# Patient Record
Sex: Female | Born: 1990 | Hispanic: Yes | Smoking: Former smoker
Health system: Southern US, Community
[De-identification: ages and names within clinical notes are randomized; demographics above are authoritative.]

## PROBLEM LIST (undated history)

## (undated) DIAGNOSIS — G43909 Migraine, unspecified, not intractable, without status migrainosus: Secondary | ICD-10-CM

## (undated) DIAGNOSIS — R87629 Unspecified abnormal cytological findings in specimens from vagina: Secondary | ICD-10-CM

## (undated) HISTORY — PX: APPENDECTOMY: SHX54

## (undated) HISTORY — PX: CHOLECYSTECTOMY: SHX55

## (undated) HISTORY — PX: KNEE SURGERY: SHX244

## (undated) HISTORY — DX: Unspecified abnormal cytological findings in specimens from vagina: R87.629

---
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
## 2008-06-14 ENCOUNTER — Emergency Department: Payer: Self-pay | Admitting: Emergency Medicine

## 2008-08-29 ENCOUNTER — Emergency Department: Payer: Self-pay | Admitting: Emergency Medicine

## 2008-09-05 ENCOUNTER — Emergency Department: Payer: Self-pay | Admitting: Emergency Medicine

## 2008-09-18 ENCOUNTER — Emergency Department: Payer: Self-pay | Admitting: Unknown Physician Specialty

## 2008-09-19 ENCOUNTER — Emergency Department: Payer: Self-pay | Admitting: Emergency Medicine

## 2008-09-23 ENCOUNTER — Emergency Department: Payer: Self-pay | Admitting: Emergency Medicine

## 2008-09-27 ENCOUNTER — Emergency Department: Payer: Self-pay | Admitting: Emergency Medicine

## 2008-10-18 ENCOUNTER — Emergency Department: Payer: Self-pay | Admitting: Unknown Physician Specialty

## 2008-10-20 ENCOUNTER — Emergency Department: Payer: Self-pay | Admitting: Emergency Medicine

## 2008-11-28 ENCOUNTER — Observation Stay: Payer: Self-pay | Admitting: Obstetrics and Gynecology

## 2009-01-28 ENCOUNTER — Observation Stay: Payer: Self-pay | Admitting: Obstetrics and Gynecology

## 2009-09-21 ENCOUNTER — Emergency Department: Payer: Self-pay | Admitting: Emergency Medicine

## 2011-10-19 ENCOUNTER — Emergency Department: Payer: Self-pay | Admitting: Emergency Medicine

## 2011-10-19 LAB — URINALYSIS, COMPLETE
Bacteria: NONE SEEN
Bilirubin,UR: NEGATIVE
Glucose,UR: NEGATIVE mg/dL (ref 0–75)
Ketone: NEGATIVE
Leukocyte Esterase: NEGATIVE
Nitrite: NEGATIVE
Ph: 6 (ref 4.5–8.0)
Protein: NEGATIVE
RBC,UR: 3 /HPF (ref 0–5)
Specific Gravity: 1.013 (ref 1.003–1.030)
Squamous Epithelial: 1
WBC UR: 2 /HPF (ref 0–5)

## 2011-10-19 LAB — PREGNANCY, URINE: Pregnancy Test, Urine: NEGATIVE m[IU]/mL

## 2011-10-22 ENCOUNTER — Emergency Department: Payer: Self-pay | Admitting: *Deleted

## 2012-01-26 ENCOUNTER — Emergency Department: Payer: Self-pay | Admitting: Emergency Medicine

## 2012-01-26 LAB — URINALYSIS, COMPLETE
Bilirubin,UR: NEGATIVE
Glucose,UR: NEGATIVE mg/dL (ref 0–75)
Ketone: NEGATIVE
Nitrite: NEGATIVE
Ph: 6 (ref 4.5–8.0)
Protein: NEGATIVE
RBC,UR: 3 /HPF (ref 0–5)
Specific Gravity: 1.001 (ref 1.003–1.030)
Squamous Epithelial: 3
WBC UR: 3 /HPF (ref 0–5)

## 2012-01-26 LAB — BASIC METABOLIC PANEL
Anion Gap: 8 (ref 7–16)
BUN: 8 mg/dL (ref 7–18)
Calcium, Total: 9.1 mg/dL (ref 8.5–10.1)
Chloride: 108 mmol/L — ABNORMAL HIGH (ref 98–107)
Co2: 26 mmol/L (ref 21–32)
Creatinine: 0.48 mg/dL — ABNORMAL LOW (ref 0.60–1.30)
EGFR (African American): >60
EGFR (Non-African Amer.): >60
Glucose: 90 mg/dL (ref 65–99)
Osmolality: 281 (ref 275–301)
Potassium: 3.6 mmol/L (ref 3.5–5.1)
Sodium: 142 mmol/L (ref 136–145)

## 2012-01-26 LAB — WET PREP, GENITAL

## 2012-01-26 LAB — CBC
HCT: 34.8 % — ABNORMAL LOW (ref 35.0–47.0)
HGB: 12 g/dL (ref 12.0–16.0)
MCH: 29.6 pg (ref 26.0–34.0)
MCHC: 34.5 g/dL (ref 32.0–36.0)
MCV: 86 fL (ref 80–100)
Platelet: 235 x10 3/mm 3 (ref 150–440)
RBC: 4.05 X10 6/mm 3 (ref 3.80–5.20)
RDW: 12.5 % (ref 11.5–14.5)
WBC: 9.1 x10 3/mm 3 (ref 3.6–11.0)

## 2012-01-27 LAB — URINE CULTURE

## 2012-10-27 ENCOUNTER — Observation Stay: Payer: Self-pay

## 2012-10-27 LAB — URINALYSIS, COMPLETE
Bacteria: NONE SEEN
Bilirubin,UR: NEGATIVE
Blood: NEGATIVE
Glucose,UR: NEGATIVE mg/dL (ref 0–75)
Ketone: NEGATIVE
Leukocyte Esterase: NEGATIVE
Nitrite: NEGATIVE
Ph: 7 (ref 4.5–8.0)
Protein: NEGATIVE
RBC,UR: NONE SEEN /HPF (ref 0–5)
Specific Gravity: 1.001 (ref 1.003–1.030)
Squamous Epithelial: 2
WBC UR: <1 /HPF (ref 0–5)

## 2012-12-28 DIAGNOSIS — G43909 Migraine, unspecified, not intractable, without status migrainosus: Secondary | ICD-10-CM | POA: Insufficient documentation

## 2012-12-28 DIAGNOSIS — F329 Major depressive disorder, single episode, unspecified: Secondary | ICD-10-CM | POA: Insufficient documentation

## 2013-03-14 ENCOUNTER — Emergency Department: Payer: Self-pay | Admitting: Emergency Medicine

## 2013-03-17 ENCOUNTER — Emergency Department: Payer: Self-pay | Admitting: Emergency Medicine

## 2013-04-18 ENCOUNTER — Emergency Department: Payer: Self-pay | Admitting: Emergency Medicine

## 2013-04-18 LAB — URINALYSIS, COMPLETE
Bilirubin,UR: NEGATIVE
Glucose,UR: NEGATIVE mg/dL (ref 0–75)
Ketone: NEGATIVE
Leukocyte Esterase: NEGATIVE
Nitrite: NEGATIVE
Ph: 5 (ref 4.5–8.0)
Protein: NEGATIVE
RBC,UR: 2 /HPF (ref 0–5)
Specific Gravity: 1.011 (ref 1.003–1.030)
Squamous Epithelial: 7
WBC UR: 5 /HPF (ref 0–5)

## 2013-04-18 LAB — COMPREHENSIVE METABOLIC PANEL
Albumin: 3.8 g/dL (ref 3.4–5.0)
Alkaline Phosphatase: 101 U/L
Anion Gap: 8 (ref 7–16)
BUN: 10 mg/dL (ref 7–18)
Bilirubin,Total: 0.3 mg/dL (ref 0.2–1.0)
Calcium, Total: 8.7 mg/dL (ref 8.5–10.1)
Chloride: 111 mmol/L — ABNORMAL HIGH (ref 98–107)
Co2: 22 mmol/L (ref 21–32)
Creatinine: 0.55 mg/dL — ABNORMAL LOW (ref 0.60–1.30)
EGFR (African American): >60
EGFR (Non-African Amer.): >60
Glucose: 95 mg/dL (ref 65–99)
Osmolality: 280 (ref 275–301)
Potassium: 3.7 mmol/L (ref 3.5–5.1)
SGOT(AST): 17 U/L (ref 15–37)
SGPT (ALT): 30 U/L (ref 12–78)
Sodium: 141 mmol/L (ref 136–145)
Total Protein: 7.2 g/dL (ref 6.4–8.2)

## 2013-04-18 LAB — CBC
HCT: 38.9 % (ref 35.0–47.0)
HGB: 13 g/dL (ref 12.0–16.0)
MCH: 29.2 pg (ref 26.0–34.0)
MCHC: 33.5 g/dL (ref 32.0–36.0)
MCV: 87 fL (ref 80–100)
Platelet: 188 10*3/uL (ref 150–440)
RBC: 4.46 10*6/uL (ref 3.80–5.20)
RDW: 13.1 % (ref 11.5–14.5)
WBC: 6.5 10*3/uL (ref 3.6–11.0)

## 2013-05-17 ENCOUNTER — Emergency Department: Payer: Self-pay | Admitting: Emergency Medicine

## 2013-07-16 ENCOUNTER — Emergency Department: Payer: Self-pay | Admitting: Emergency Medicine

## 2013-07-16 LAB — LIPASE, BLOOD: Lipase: 248 U/L (ref 73–393)

## 2013-07-16 LAB — CBC WITH DIFFERENTIAL/PLATELET
Basophil #: 0.1 10*3/uL (ref 0.0–0.1)
Basophil %: 0.9 %
Eosinophil #: 0.3 10*3/uL (ref 0.0–0.7)
Eosinophil %: 3.2 %
HCT: 39.9 % (ref 35.0–47.0)
HGB: 13.4 g/dL (ref 12.0–16.0)
Lymphocyte #: 3.1 10*3/uL (ref 1.0–3.6)
Lymphocyte %: 38 %
MCH: 29.2 pg (ref 26.0–34.0)
MCHC: 33.7 g/dL (ref 32.0–36.0)
MCV: 87 fL (ref 80–100)
Monocyte #: 0.3 x10 3/mm (ref 0.2–0.9)
Monocyte %: 4.3 %
Neutrophil #: 4.4 10*3/uL (ref 1.4–6.5)
Neutrophil %: 53.6 %
Platelet: 234 10*3/uL (ref 150–440)
RBC: 4.61 10*6/uL (ref 3.80–5.20)
RDW: 13.4 % (ref 11.5–14.5)
WBC: 8.1 10*3/uL (ref 3.6–11.0)

## 2013-07-16 LAB — COMPREHENSIVE METABOLIC PANEL
Albumin: 4 g/dL (ref 3.4–5.0)
Alkaline Phosphatase: 92 U/L
Anion Gap: 2 — ABNORMAL LOW (ref 7–16)
BUN: 7 mg/dL (ref 7–18)
Bilirubin,Total: 0.2 mg/dL (ref 0.2–1.0)
Calcium, Total: 8.9 mg/dL (ref 8.5–10.1)
Chloride: 109 mmol/L — ABNORMAL HIGH (ref 98–107)
Co2: 28 mmol/L (ref 21–32)
Creatinine: 0.83 mg/dL (ref 0.60–1.30)
EGFR (African American): >60
EGFR (Non-African Amer.): >60
Glucose: 77 mg/dL (ref 65–99)
Osmolality: 274 (ref 275–301)
Potassium: 3.5 mmol/L (ref 3.5–5.1)
SGOT(AST): 27 U/L (ref 15–37)
SGPT (ALT): 18 U/L (ref 12–78)
Sodium: 139 mmol/L (ref 136–145)
Total Protein: 7.8 g/dL (ref 6.4–8.2)

## 2013-07-16 LAB — URINALYSIS, COMPLETE
Bilirubin,UR: NEGATIVE
Blood: NEGATIVE
Glucose,UR: NEGATIVE mg/dL (ref 0–75)
Ketone: NEGATIVE
Nitrite: NEGATIVE
Ph: 6 (ref 4.5–8.0)
Protein: NEGATIVE
RBC,UR: 5 /HPF (ref 0–5)
Specific Gravity: 1.016 (ref 1.003–1.030)
Squamous Epithelial: 5
WBC UR: 6 /HPF (ref 0–5)

## 2013-07-16 LAB — WET PREP, GENITAL

## 2013-07-16 LAB — GC/CHLAMYDIA PROBE AMP

## 2014-01-20 LAB — URINALYSIS, COMPLETE
Bilirubin,UR: NEGATIVE
Blood: NEGATIVE
Glucose,UR: NEGATIVE mg/dL (ref 0–75)
Ketone: NEGATIVE
Nitrite: NEGATIVE
Ph: 7 (ref 4.5–8.0)
Protein: NEGATIVE
RBC,UR: 1 /HPF (ref 0–5)
Specific Gravity: 1.01 (ref 1.003–1.030)
Squamous Epithelial: 5
WBC UR: 3 /HPF (ref 0–5)

## 2014-01-20 LAB — COMPREHENSIVE METABOLIC PANEL
Albumin: 3.7 g/dL (ref 3.4–5.0)
Alkaline Phosphatase: 91 U/L
Anion Gap: 6 — ABNORMAL LOW (ref 7–16)
BUN: 7 mg/dL (ref 7–18)
Bilirubin,Total: 0.2 mg/dL (ref 0.2–1.0)
Calcium, Total: 8.8 mg/dL (ref 8.5–10.1)
Chloride: 109 mmol/L — ABNORMAL HIGH (ref 98–107)
Co2: 25 mmol/L (ref 21–32)
Creatinine: 0.57 mg/dL — ABNORMAL LOW (ref 0.60–1.30)
EGFR (African American): >60
EGFR (Non-African Amer.): >60
Glucose: 90 mg/dL (ref 65–99)
Osmolality: 277 (ref 275–301)
Potassium: 3.8 mmol/L (ref 3.5–5.1)
SGOT(AST): 51 U/L — ABNORMAL HIGH (ref 15–37)
SGPT (ALT): 74 U/L — ABNORMAL HIGH
Sodium: 140 mmol/L (ref 136–145)
Total Protein: 7.4 g/dL (ref 6.4–8.2)

## 2014-01-20 LAB — CBC WITH DIFFERENTIAL/PLATELET
Basophil #: 0 10*3/uL (ref 0.0–0.1)
Basophil %: 0.4 %
Eosinophil #: 0.2 10*3/uL (ref 0.0–0.7)
Eosinophil %: 1.5 %
HCT: 40.8 % (ref 35.0–47.0)
HGB: 13.3 g/dL (ref 12.0–16.0)
Lymphocyte #: 2.4 10*3/uL (ref 1.0–3.6)
Lymphocyte %: 22.8 %
MCH: 29.2 pg (ref 26.0–34.0)
MCHC: 32.7 g/dL (ref 32.0–36.0)
MCV: 89 fL (ref 80–100)
Monocyte #: 0.5 x10 3/mm (ref 0.2–0.9)
Monocyte %: 4.4 %
Neutrophil #: 7.5 10*3/uL — ABNORMAL HIGH (ref 1.4–6.5)
Neutrophil %: 70.9 %
Platelet: 249 10*3/uL (ref 150–440)
RBC: 4.58 10*6/uL (ref 3.80–5.20)
RDW: 13.1 % (ref 11.5–14.5)
WBC: 10.5 10*3/uL (ref 3.6–11.0)

## 2014-01-20 LAB — PREGNANCY, URINE: Pregnancy Test, Urine: NEGATIVE m[IU]/mL

## 2014-01-20 LAB — LIPASE, BLOOD: Lipase: 123 U/L (ref 73–393)

## 2014-01-21 ENCOUNTER — Observation Stay: Payer: Self-pay | Admitting: Surgery

## 2014-01-24 LAB — PATHOLOGY REPORT

## 2014-09-03 NOTE — Op Note (Signed)
PATIENT NAME:  Lisa [name], Lisa [name] MR#:  [id number] DATE OF BIRTH:  May 18, 1990  DATE OF PROCEDURE:  01/21/2014  PREOPERATIVE DIAGNOSIS:  Acute appendicitis.  POSTOPERATIVE DIAGNOSIS:  Acute appendicitis.  OPERATION:  Laparoscopic appendectomy.  ANESTHESIA:  General.  SURGEON:  Quentin Orealph L. Ely III, MD  OPERATIVE PROCEDURE:  With the patient in the supine position after administering appropriate general anesthesia, the patient's abdomen was prepped with ChloraPrep and draped in sterile towels.  The patient was placed in the head-down feet-up position. A small infraumbilical incision was made in standard fashion and carried down bluntly through the subcutaneous tissue. A Veress needle was used to cannulate the peritoneal cavity. CO2 was insufflated to appropriate pressure measurements. When approximately 2.5 L of CO2 were instilled, the Veress needle was withdrawn and an 11 mm [employer] port inserted into the peritoneal cavity. Intraperitoneal position was confirmed, and CO2 was reinsufflated. A midepigastric transverse incision was made and an 11 mm port inserted under direct vision. The right lower quadrant was examined. The cecum appeared to be in the right upper quadrant and the appendix lying across toward the midline. It was obviously thickened and injected with some suppurative changes at the base. In the previous cesarean section scar in the suprapubic area, a small incision was made and a 12 mm port inserted under direct vision. Using cameras of the upper port, the midline was inspected. No bowel injuries and no adhesions were identified. The appendix was elevated. The mesoappendix was divided with 2 applications of EndoGIA stapler carrying a white load. The base of the appendix was divided with a single application of EndoGIA stapler carrying a blue load. The division appeared to be right at the base of the appendix. The specimen was captured and the Endo Catch apparatus removed without  difficulty.   The abdomen was then copiously irrigated with warm saline solution. The suprapubic and umbilical incisions were closed using figure-of-eight sutures of 0 Vicryl using the suture passer under direct vision. The abdomen was then desufflated. Remaining ports were withdrawn without difficulty. The skin incisions were closed with 5-0 nylon. The area was infiltrated with 0.25% Marcaine for postoperative pain control. Sterile dressings were applied. The patient returned to the recovery room having tolerated the procedure well. Sponge, instrument, and needle counts were correct x 2 in the operating room.     ____________________________ Quentin Orealph L. Ely III, MD rle:nb D: 01/21/2014 01:53:00 ET T: 01/21/2014 02:19:12 ET JOB#: [id number]  cc: Quentin Orealph L. Ely III, MD, <Dictator> Quentin OreALPH L ELY MD ELECTRONICALLY SIGNED 01/21/2014 4:27

## 2014-09-03 NOTE — H&P (Signed)
PATIENT NAME:  Lisa [name], Lisa [name] MR#:  [id number] DATE OF BIRTH:  Apr 22, 1991  DATE OF ADMISSION:  01/20/2014  PRIMARY CARE PHYSICIAN: None.   ADMITTING PHYSICIAN: Dr. Michela PitcherEly.   CHIEF COMPLAINT: Abdominal pain, nausea, vomiting.   BRIEF HISTORY: [name] is a 24 year old Hispanic woman seen in the Emergency Room with a two week history of dizziness, generalized malaise, week history of back pain and nausea and 24-hour history of abdominal pain, nausea and vomiting. She has been feeling poorly for a couple of weeks, lightheaded and uncomfortable. Over the last week she is noted increasing back and generalized mid epigastric abdominal pain. Her symptoms worsened suddenly today where she felt pronounced right lower quadrant left lower quadrant pain associated with marked vomiting. She presented to the emergency room for further evaluation. Work-up in the emergency room reveals a normal white blood cell count, normal electrolytes, normal vital signs, no fever, tenderness right lower quadrant. She underwent CT scan which demonstrated dilated appendix with possible periappendiceal inflammatory changes. The surgical service was consulted with diagnosis of acute appendicitis.   She denies any other significant GI symptoms.   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 No history of hepatitis, yellow jaundice or diverticulitis. Only other surgery was a C-section. She denies any history of cardiac disease, hypertension, or diabetes. She has a history of hypothyroidism and fibromyalgia.   CURRENT MEDICATIONS: Include medication for fibromyalgia and for her thyroid disease.   REVIEW OF SYSTEMS: Otherwise unremarkable.   SOCIAL HISTORY: She is not a cigarette smoker.   PHYSICAL EXAMINATION:  GENERAL: Clearly uncomfortable, lying in bed, complaining of midepigastric periumbilical abdominal pain.  VITALS: Blood pressure is 120/70, heart  rate is 90 and regular.  HEENT: Exam reveals no scleral icterus. No pupillary abnormalities. No facial deformities.  NECK: Supple, nontender with midline trachea.  CHEST: Clear with no adventitious sounds. She has normal pulmonary excursion.  CARDIAC: No murmurs or gallops to my ear. She seems to be in normal sinus rhythm.  ABDOMEN: Her abdomen is generally soft. She has generalized abdominal tenderness. midepigastric suprapubic left lower quadrant and right lower quadrant. She has perhaps more tenderness on the right side than on the left, but she does have generalized abdominal discomfort. She has hypoactive bowel sounds.  EXTREMITIES: Lower extremity exam reveals full range of motion, no deformities.  PSYCHIATRIC: Normal affect. Normal orientation. The interview was conducted through the hospital interpreter.   ASSESSMENT AND PLAN: This woman certainly has an atypical presentation for appendicitis. Her clinical examination and laboratory work are borderline for this particular diagnosis. However, the CT scan, and [name] have reviewed it personally, do suggest a dilated appendix with possible periappendiceal inflammatory changes. In this setting I do not think we have any options other than to pursue possible laparoscopy. [name] outlined that plan to the patient and her husband. They are in agreement. Risks, benefits, and options have been outlined and accepted.    ____________________________ Carmie Endalph L. Ely III, MD rle:JT D: 01/21/2014 00:08:00 ET T: 01/21/2014 01:30:54 ET JOB#: [id number]  cc: Quentin Orealph L. Ely III, MD, <Dictator> Quentin OreALPH L ELY MD ELECTRONICALLY SIGNED 01/21/2014 4:27

## 2014-12-24 ENCOUNTER — Emergency Department
Admission: EM | Admit: 2014-12-24 | Discharge: 2014-12-24 | Disposition: A | Discharge status: Home / Self Care | Payer: Self-pay | Attending: Emergency Medicine | Admitting: Emergency Medicine

## 2014-12-24 ENCOUNTER — Emergency Department: Payer: Self-pay

## 2014-12-24 ENCOUNTER — Encounter: Payer: Self-pay | Admitting: Emergency Medicine

## 2014-12-24 DIAGNOSIS — Z880 Allergy status to penicillin: Secondary | ICD-10-CM | POA: Insufficient documentation

## 2014-12-24 DIAGNOSIS — W208XXA Other cause of strike by thrown, projected or falling object, initial encounter: Secondary | ICD-10-CM | POA: Insufficient documentation

## 2014-12-24 DIAGNOSIS — Y9289 Other specified places as the place of occurrence of the external cause: Secondary | ICD-10-CM | POA: Insufficient documentation

## 2014-12-24 DIAGNOSIS — L03012 Cellulitis of left finger: Secondary | ICD-10-CM | POA: Insufficient documentation

## 2014-12-24 DIAGNOSIS — Y990 Civilian activity done for income or pay: Secondary | ICD-10-CM | POA: Insufficient documentation

## 2014-12-24 DIAGNOSIS — Y9389 Activity, other specified: Secondary | ICD-10-CM | POA: Insufficient documentation

## 2014-12-24 MED ORDER — OXYCODONE-ACETAMINOPHEN 5-325 MG PO TABS: 1.0000 | ORAL_TABLET | Freq: Four times a day (QID) | ORAL | Status: DC | PRN

## 2014-12-24 MED ORDER — SULFAMETHOXAZOLE-TRIMETHOPRIM 800-160 MG PO TABS: 1.0000 | ORAL_TABLET | Freq: Two times a day (BID) | ORAL | Status: DC

## 2014-12-24 MED ORDER — OXYCODONE HCL 5 MG PO TABS
5.0000 mg | ORAL_TABLET | Freq: Once | ORAL | Status: AC
  Administered 2014-12-24: 5 mg via ORAL
  Filled 2014-12-24: qty 1

## 2014-12-24 NOTE — ED Provider Notes (Signed)
[facility] Emergency Department Provider Note  ____________________________________________  Time seen: Approximately 7:57 AM  I have reviewed the triage vital signs and the nursing notes.   HISTORY  Chief Complaint Finger Injury  HPI Lisa [name] is a 24 y.o. female who presents to the emergency department for evaluation of left long finger pain and swelling. She states that she scratched her finger on a paper wrapper while at work 4 days ago, then cleaning solution spilled into the wound and she has had pain, swelling, and burning since which is worsening every day.   History reviewed. No pertinent past medical history.  There are no active problems to display for this patient.   Past Surgical History  Procedure Laterality Date  . Appendectomy    . Cholecystectomy      Current Outpatient Rx  Name  Route  Sig  Dispense  Refill  . oxyCODONE-acetaminophen (ROXICET) 5-325 MG per tablet   Oral   Take 1 tablet by mouth every 6 (six) hours as needed.   9 tablet   0   . sulfamethoxazole-trimethoprim (BACTRIM DS,SEPTRA DS) 800-160 MG per tablet   Oral   Take 1 tablet by mouth 2 (two) times daily.   20 tablet   0     Allergies Amoxicillin  History reviewed. No pertinent family history.  Social History Social History  Substance Use Topics  . Smoking status: Never Smoker   . Smokeless tobacco: None  . Alcohol Use: No    Review of Systems   Constitutional: No fever/chills Eyes: No visual changes. ENT: No congestion or rhinorrhea Cardiovascular: Denies chest pain. Respiratory: Denies shortness of breath. Gastrointestinal: No abdominal pain.  No nausea, no vomiting.  No diarrhea.  No constipation. Genitourinary: Negative for dysuria. Musculoskeletal: Negative for back pain. Skin: Swelling and pain to the left third finger Neurological: Negative for headaches, focal weakness or numbness.  10-point ROS otherwise  negative.  ____________________________________________   PHYSICAL EXAM:  VITAL SIGNS: ED Triage Vitals  Enc Vitals Group     BP --      Pulse --      Resp --      Temp --      Temp src --      SpO2 --      Weight --      Height --      Head Cir --      Peak Flow --      Pain Score 12/24/14 0747 10     Pain Loc --      Pain Edu? --      Excl. in GC? --     Constitutional: Alert and oriented. Well appearing and in no acute distress. Eyes: Conjunctivae are normal. PERRL. EOMI. Head: Atraumatic. Nose: No congestion/rhinnorhea. Mouth/Throat: Mucous membranes are moist.  Oropharynx non-erythematous. No oral lesions. Neck: No stridor. Cardiovascular: Normal rate, regular rhythm.  Good peripheral circulation. Respiratory: Normal respiratory effort.  No retractions. Lungs CTAB. Gastrointestinal: Soft and nontender. No distention. No abdominal bruits.  Musculoskeletal: No lower extremity tenderness nor edema.  No joint effusions. Neurologic:  Normal speech and language. No gross focal neurologic deficits are appreciated. Speech is normal. No gait instability. Skin:  Erythema and mild fluctuance present to left third digit at DIP and beyond. Psychiatric: Mood and affect are normal. Speech and behavior are normal.  ____________________________________________   LABS (all labs ordered are listed, but only abnormal results are displayed)  Labs Reviewed - No  data to display ____________________________________________  EKG  ____________________________________________  RADIOLOGY  Negative for acute bony abnormality. Images reviewed by me. ____________________________________________   PROCEDURES  Procedure(s) performed:   INCISION AND DRAINAGE Performed by: Kem Boroughs Consent: Verbal consent obtained. Risks and benefits: risks, benefits and alternatives were discussed Type: abscess  Body area:left hand, long finger  Anesthesia:Gebauer's Spray  Anesthetic  Puncture was made with a scalpel.  Anesthetic total: 10 second spray  Drainage: purulent  Drainage amount: scant  Patient tolerance: Patient tolerated the procedure well with no immediate complications.    ____________________________________________   INITIAL IMPRESSION / ASSESSMENT AND PLAN / ED COURSE  Pertinent labs & imaging results that were available during my care of the patient were reviewed by me and considered in my medical decision making (see chart for details).  Patient was advised to soak her hand in warm Epson salt water 4 times per day. She was also advised to take the antibiotic until finished. She was advised to return to the ER for symptoms that change or worsen if unable to schedule an appointment with primary care. ____________________________________________   FINAL CLINICAL IMPRESSION(S) / ED DIAGNOSES  Final diagnoses:  Paronychia of finger of left hand       Chinita Pester, FNP 12/24/14 1201  Jene Every, MD 12/24/14 1400

## 2014-12-24 NOTE — ED Notes (Signed)
Interpreter requested 

## 2014-12-24 NOTE — ED Notes (Signed)
Swelling and redness noted to middle finger on left hand.

## 2014-12-24 NOTE — Discharge Instructions (Signed)
Paroniquia °(Paronychia) °La paroniquia es una reacción inflamatoria que involucra los pliegues de la piel que rodea la uña. Generalmente se debe a una infección en la piel que rodea la uña. La causa más común es el lavado frecuente de las manos (como en el caso de los barman, mozos, enfermeros y otras personas que necesitan mojarse las manos. Esto hace que la piel que rodea la uña sea susceptible a las infecciones por bacterias (gérmenes) u hongos. Otros factores que predisponen son: °· Trabajos de manicuría agresivos. °· Morderse las uñas. °· Succionar el pulgar. °La causa más común es una infección por estafilococo (un germen) o una infección por hongos (candida). Cuando la causa es un germen, generalmente el comienzo es súbito y doloroso con enrojecimiento, hinchazón, pus y dolor. Puede aparecer bajo la uña y formar un absceso (acumulación de pus) o formar un absceso alrededor de la uña. Si la uña está infectada con un hongo, el tratamiento es prolongado y puede requerir medicamentos por vía oral durante un año. El médico decidirá la cantidad de tiempo que requerirá el tratamiento. La paroniquia causada por bacterias (gérmenes) puede evitarse si no se quitan los padrastros o se cortan las cutículas. Cuando la infección ocurre en la punta del dedo se denomina panadizo. Si la causa es el virus del herpes simplex, se denomina panadizo herpético. °TRATAMIENTO °El tratamiento consiste en la incisión y el drenaje cuando hay un absceso. Esto significa que el absceso debe abrirse para que el pus pueda salir. Debe seguir los cuidados que se recomiendan a continuación. °INSTRUCCIONES PARA EL CUIDADO DOMICILIARIO °· Es importante mantener las áreas afectadas limpias y secas. Use guantes de goma o plástico sobre guantes de algodón cuando deba mojarse la mano. °· Entre los períodos de enjuagues con agua tibia, mantenga la herida limpia, seca y vendada como se lo indicó el profesional que lo asiste. °· Si tiene una infección  bacteriana, sumerja la mano en agua tibia entre 15 y 20 minutos, tres o cuatro veces por día. Las infecciones fúngicas son muy difíciles de tratar, por lo tanto requieren tratamiento durante largos períodos. °· Tome los antibióticos (medicamentos que destruyen los gérmenes) para las infecciones bacterianas, según las indicaciones. Termine todos los medicamentos, aún si el problema parece estar resuelto antes de finalizarlos. °· Utilice los medicamentos de venta libre o de prescripción para el dolor, el malestar o la fiebre, según se lo indique el profesional que lo asiste. °SOLICITE ATENCIÓN MÉDICA DE INMEDIATO SI: °· Presenta enrojecimiento, hinchazón o aumento del dolor en la herida. °· Aparece pus en la herida. °· Tiene fiebre. °· Advierte un olor fétido que proviene de la herida o del vendaje. °Document Released: 02/06/2005 Document Revised: 07/22/2011 °ExitCare® Patient Information ©2015 ExitCare, LLC. This information is not intended to replace advice given to you by your health care provider. Make sure you discuss any questions you have with your health care provider. ° °

## 2014-12-24 NOTE — ED Notes (Signed)
Swelling of middle finger on left hand. Pt complains of pain to site. Pt states that while at work she scratched her finger on a wrapper and cleaning supplies for the machinery fell onto her finger and that is when the pain and swelling started. Pt states it started as a burning sensation.

## 2015-01-13 ENCOUNTER — Emergency Department
Admission: EM | Admit: 2015-01-13 | Discharge: 2015-01-13 | Disposition: A | Discharge status: Home / Self Care | Payer: Self-pay | Attending: Emergency Medicine | Admitting: Emergency Medicine

## 2015-01-13 ENCOUNTER — Encounter: Payer: Self-pay | Admitting: Medical Oncology

## 2015-01-13 DIAGNOSIS — N3090 Cystitis, unspecified without hematuria: Secondary | ICD-10-CM | POA: Insufficient documentation

## 2015-01-13 DIAGNOSIS — M797 Fibromyalgia: Secondary | ICD-10-CM | POA: Insufficient documentation

## 2015-01-13 DIAGNOSIS — Z3202 Encounter for pregnancy test, result negative: Secondary | ICD-10-CM | POA: Insufficient documentation

## 2015-01-13 LAB — COMPREHENSIVE METABOLIC PANEL
ALT: 19 U/L (ref 14–54)
AST: 21 U/L (ref 15–41)
Albumin: 4.4 g/dL (ref 3.5–5.0)
Alkaline Phosphatase: 82 U/L (ref 38–126)
Anion gap: 8 (ref 5–15)
BUN: 11 mg/dL (ref 6–20)
CO2: 25 mmol/L (ref 22–32)
Calcium: 9.4 mg/dL (ref 8.9–10.3)
Chloride: 107 mmol/L (ref 101–111)
Creatinine, Ser: 0.63 mg/dL (ref 0.44–1.00)
GFR calc Af Amer: >60 mL/min (ref >60)
GFR calc non Af Amer: >60 mL/min (ref >60)
Glucose, Bld: 107 mg/dL — ABNORMAL HIGH (ref 65–99)
Potassium: 3.6 mmol/L (ref 3.5–5.1)
Sodium: 140 mmol/L (ref 135–145)
Total Bilirubin: 0.6 mg/dL (ref 0.3–1.2)
Total Protein: 7.3 g/dL (ref 6.5–8.1)

## 2015-01-13 LAB — URINALYSIS COMPLETE WITH MICROSCOPIC (ARMC ONLY)
Bilirubin Urine: NEGATIVE
Glucose, UA: NEGATIVE mg/dL
Hgb urine dipstick: NEGATIVE
Ketones, ur: NEGATIVE mg/dL
Nitrite: NEGATIVE
Protein, ur: NEGATIVE mg/dL
Specific Gravity, Urine: 1.025 (ref 1.005–1.030)
pH: 5 (ref 5.0–8.0)

## 2015-01-13 LAB — CBC
HCT: 39.6 % (ref 35.0–47.0)
Hemoglobin: 13.5 g/dL (ref 12.0–16.0)
MCH: 29.7 pg (ref 26.0–34.0)
MCHC: 34.2 g/dL (ref 32.0–36.0)
MCV: 86.9 fL (ref 80.0–100.0)
Platelets: 221 10*3/uL (ref 150–440)
RBC: 4.56 MIL/uL (ref 3.80–5.20)
RDW: 13 % (ref 11.5–14.5)
WBC: 8.7 10*3/uL (ref 3.6–11.0)

## 2015-01-13 MED ORDER — DIAZEPAM 5 MG PO TABS: 5.0000 mg | ORAL_TABLET | Freq: Three times a day (TID) | ORAL | Status: DC | PRN

## 2015-01-13 MED ORDER — SULFAMETHOXAZOLE-TRIMETHOPRIM 800-160 MG PO TABS
1.0000 | ORAL_TABLET | Freq: Once | ORAL | Status: AC
  Administered 2015-01-13: 1 via ORAL
  Filled 2015-01-13: qty 1

## 2015-01-13 MED ORDER — SULFAMETHOXAZOLE-TRIMETHOPRIM 800-160 MG PO TABS: 1.0000 | ORAL_TABLET | Freq: Two times a day (BID) | ORAL | Status: DC

## 2015-01-13 MED ORDER — DIAZEPAM 5 MG PO TABS
5.0000 mg | ORAL_TABLET | Freq: Once | ORAL | Status: AC
  Administered 2015-01-13: 5 mg via ORAL
  Filled 2015-01-13: qty 1

## 2015-01-13 NOTE — Discharge Instructions (Signed)
Fibromialgia (Fibromyalgia) La fibromialgia es un trastorno que con frecuencia es mal interpretado. Se asocia a dolores musculares y sensibilidad que aparece y desaparece. Generalmente se asocia a fatiga y trastornos del sueo. Aunque tiende a ser un problema crnico, la fibromialgia no pone en peligro la vida. CAUSAS La causa exacta de la fibromialgia es desconocida. Las personas que portan ciertos tipos de genes tienen ms predisposicin a Environmental education officer fibromialgia y [employer]. Ciertos factores pueden jugar un papel como desencadenantes, por ejemplo:  Trastornos de la columna vertebral.  Artritis.  Traumatismos graves y otros factores de estrs fsico.  Factores de estrs emocional. SNTOMAS  El principal sntoma es el dolor y la rigidez en los msculos y articulaciones, que pueden variar con el [place].  Problemas con el dormir y Control and instrumentation engineer. Otros sntomas son:  Problemas del intestino y de la vejiga.  Dolor de [place].  Problemas visuales.  Problemas de olores y ruidos.  Depresin o cambios de humor.  Dolor con Tax adviser (dismenorrea).  Sequedad en la piel y/o los ojos. DIAGNSTICO No hay pruebas especficas para el diagnstico de la fibromialgia. [place] pueden ser diagnosticados de manera precisa por los sntomas especficos que presentan. El diagnstico se realiza descartando que existan otros motivos que puedan [employer]. TRATAMIENTO Esta enfermedad no tiene [place]. El control incluye medicamentos y un estilo de vida [place] y saludable. El objetivo es mejorar el estado fsico, disminuir el dolor y Careers information officer dormir. INSTRUCCIONES PARA EL CUIDADO DOMICILIARIO  Tome slo medicamentos de venta libre o prescriptos, segn las indicaciones del mdico. Las pldoras para dormir, los tranquilizantes y los medicamentos para Chief Technology Officer pueden hacer que estos problemas empeoren.  El ejercicio aerbico de bajo impacto es til y se recomienda para el  tratamiento de la fibromialgia. Al comenzar Brewing technologist. Al incrementar gradualmente la tolerancia, esto se supera.  Aprender tcnicas de relajacin y a controlar el estrs lo ayudar. La biorretroalimentacin, las tcnicas de imaginera visual, la hipnosis, la Chief Technology Officer, el yoga y la meditacin son todas formas de control del estrs.  Los medicamentos antiinflamatorios podrn ser de utilidad a corto plazo, al igual que la fisioterapia.  Acupuntura o tratamientos de masajes.  Medicamentos de Microbiologist de msculos por parte del profesional que lo asiste.  Evitar situaciones de estrs.  Planear un estilo de vida saludable en relacin a la dieta, el sueo, el descanso, el ejercicio y los amigos.  Descubrir y Education administrator un pasatiempo que usted disfrute.  Unirse a un grupo de apoyo de fibromialgia para Product/process development scientist, compartir ideas y consejos. Esto podra ayudarlo. SOLICITE ATENCIN MDICA SI: No tiene buenos resultados o mejoras en su tratamiento. PARA OBTENER MS INFORMACIN National Fibromyalgia Association : www.fmaware.orgArthritis Foundation: Investment banker, operational.arthritis.org Document Released: 04/29/2005 Document Revised: 07/22/2011 [facility] Patient Information 2015 [place], [place]. This information is not intended to replace advice given to you by your health care provider. Make sure you discuss any questions you have with your health care provider.  Infeccin urinaria  (Urinary Tract Infection)  La infeccin urinaria puede ocurrir en Corporate treasurer del tracto urinario. El tracto urinario es un sistema de drenaje del cuerpo por el que se eliminan los desechos y el exceso de [place]. El tracto urinario est formado por dos riones, dos urteres, la vejiga y Engineer, mining. Los riones son rganos que tienen forma de frijol. Cada rin tiene aproximadamente el tamao del puo. Estn situados debajo de las [place], uno a cada lado de la columna vertebral CAUSAS  La causa de la  infeccin son los microbios, que son organismos microscpicos, que incluyen hongos, virus, y bacterias. Estos organismos son tan pequeos que slo pueden verse a travs del microscopio. Las bacterias son los microorganismos que ms comnmente causan infecciones urinarias.  SNTOMAS  Los sntomas pueden variar segn la edad y el sexo del paciente y por la ubicacin de la infeccin. Los sntomas en las mujeres jvenes incluyen la necesidad frecuente e intensa de orinar y una sensacin dolorosa de ardor en la vejiga o en la uretra durante la miccin. Las mujeres y los hombres mayores podrn sentir cansancio, temblores y debilidad y Futures trader musculares y Engineer, mining abdominal. Si tiene [place], puede significar que la infeccin est en los riones. Otros sntomas son dolor en la espalda o en los lados debajo de las [place], nuseas y vmitos.  DIAGNSTICO  Para diagnosticar una infeccin urinaria, el mdico le preguntar acerca de sus sntomas. [employer] una [place] de [place]. La muestra de orina se analiza para Engineer, manufacturing bacterias y glbulos blancos de Risk manager. Los glbulos blancos se forman en el organismo para ayudar a Artist las infecciones.  TRATAMIENTO  Por lo general, las infecciones urinarias pueden tratarse con medicamentos. Debido a que la [employer] de las infecciones son causadas por bacterias, por lo general pueden tratarse con antibiticos. La eleccin del antibitico y la duracin del tratamiento depender de sus sntomas y el tipo de bacteria causante de la infeccin.  INSTRUCCIONES PARA EL CUIDADO EN EL HOGAR   Si le recetaron antibiticos, tmelos exactamente como su mdico le indique. Termine el medicamento aunque se sienta mejor despus de haber tomado slo algunos.  Beba gran cantidad de lquido para mantener la orina de tono claro o color amarillo plido.  Evite la cafena, el t y las [address]. Estas sustancias irritan la vejiga.  Vaciar la vejiga con frecuencia.  Evite retener la orina durante largos perodos.  Vace la vejiga antes y despus de Management consultant.  Despus de mover el intestino, las mujeres deben higienizarse la regin perineal desde adelante hacia atrs. Use slo un papel tissue por vez. SOLICITE ATENCIN MDICA SI:   Siente dolor en la espalda.  Le sube la fiebre.  Los sntomas no mejoran luego de [address]. SOLICITE ATENCIN MDICA DE INMEDIATO SI:   Siente dolor intenso en la espalda o en la zona inferior del abdomen.  Comienza a sentir escalofros.  Tiene nuseas o vmitos.  Tiene una sensacin continua de quemazn o molestias al [employer]. ASEGRESE DE QUE:   Comprende estas instrucciones.  Controlar su enfermedad.  Solicitar ayuda de inmediato si no mejora o empeora. Document Released: 02/06/2005 Document Revised: 01/22/2012 [facility] Patient Information 2015 [place], [place]. This information is not intended to replace advice given to you by your health care provider. Make sure you discuss any questions you have with your health care provider.

## 2015-01-13 NOTE — ED Notes (Signed)
Per interpreter. Pt began having chest, left arm, abd and back pain 2 hrs pta. Has seen PCP before for same pain and they couldn't find a diagnosis.

## 2015-01-13 NOTE — ED Notes (Signed)
MD at bedside, Dimas Millin, at bedside with this RN

## 2015-01-13 NOTE — ED Notes (Signed)
Urine Pregnancy NEGATIVE

## 2015-01-13 NOTE — ED Provider Notes (Signed)
[facility] Emergency Department Provider Note     Time seen: Time stent  I have reviewed the triage vital signs and the nursing notes.   HISTORY  Chief Complaint Chest Pain and Abdominal Pain    HPI Lisa [name] is a 24 y.o. female who presents to ER with chest left arm and abdomen pain as well as back pain to her prior to arrival. Patient seen her primary care doctor before for the same but couldn't find diagnosis. She denies fevers chills or other complaints.   History reviewed. No pertinent past medical history.  There are no active problems to display for this patient.   Past Surgical History  Procedure Laterality Date  . Appendectomy    . Cholecystectomy      Allergies Amoxicillin  Social History Social History  Substance Use Topics  . Smoking status: Never Smoker   . Smokeless tobacco: None  . Alcohol Use: No    Review of Systems Constitutional: Negative for fever. Eyes: Negative for visual changes. ENT: Negative for sore throat. Cardiovascular: As if her chest pain Respiratory: Negative for shortness of breath. Gastrointestinal: Positive for abdominal pain Genitourinary: Negative for dysuria. Musculoskeletal: Positive for back pain Skin: Negative for rash. Neurological: Negative for headaches, focal weakness or numbness.  10-point ROS otherwise negative.  ____________________________________________   PHYSICAL EXAM:  VITAL SIGNS: ED Triage Vitals  Enc Vitals Group     BP 01/13/15 1831 113/74 mmHg     Pulse Rate 01/13/15 1831 73     Resp 01/13/15 1831 18     Temp 01/13/15 1831 97.8 F (36.6 C)     Temp Source 01/13/15 1831 Oral     SpO2 01/13/15 1831 100 %     Weight 01/13/15 1831 146 lb (66.225 kg)     Height 01/13/15 1831 5\' 6"  (1.676 m)     Head Cir --      Peak Flow --      Pain Score 01/13/15 1832 9     Pain Loc --      Pain Edu? --      Excl. in GC? --     Constitutional: Alert and  oriented. Well appearing and in no distress. Eyes: Conjunctivae are normal. PERRL. Normal extraocular movements. ENT   Head: Normocephalic and atraumatic.   Nose: No congestion/rhinnorhea.   Mouth/Throat: Mucous membranes are moist.   Neck: No stridor. Cardiovascular: Normal rate, regular rhythm. Normal and symmetric distal pulses are present in all extremities. No murmurs, rubs, or gallops. Respiratory: Normal respiratory effort without tachypnea nor retractions. Breath sounds are clear and equal bilaterally. No wheezes/rales/rhonchi. Gastrointestinal: Soft and nontender. No distention. No abdominal bruits.  Musculoskeletal: Nontender with normal range of motion in all extremities. No joint effusions.  No lower extremity tenderness nor edema. Neurologic:  Normal speech and language. No gross focal neurologic deficits are appreciated. Speech is normal. No gait instability. Skin:  There is an abrasion in the right upper chest wall from scratching. Psychiatric: Mood and affect are normal. Speech and behavior are normal. Patient exhibits appropriate insight and judgment.  ____________________________________________  ED COURSE:  Pertinent labs & imaging results that were available during my care of the patient were reviewed by me and considered in my medical decision making (see chart for details).  ____________________________________________    LABS (pertinent positives/negatives)  Labs Reviewed  COMPREHENSIVE METABOLIC PANEL - Abnormal; Notable for the following:    Glucose, Bld 107 (*)    All  other components within normal limits  URINALYSIS COMPLETEWITH MICROSCOPIC ([facility] ONLY) - Abnormal; Notable for the following:    Color, Urine YELLOW (*)    APPearance CLEAR (*)    Leukocytes, UA TRACE (*)    Bacteria, UA RARE (*)    Squamous Epithelial / LPF 0-5 (*)    All other components within normal limits  CBC  POC URINE PREG, ED    ____________________________________________  FINAL ASSESSMENT AND PLAN  Diffuse pain, cystitis  Plan: Patient with labs and imaging as dictated above. Symptoms either coming from anxiety or fibromyalgia. She'll be started on Valium here to take as needed, also will be on a short course of Septra for UTI.   Emily Filbert, MD   Emily Filbert, MD 01/13/15 2039

## 2015-07-27 DIAGNOSIS — E663 Overweight: Secondary | ICD-10-CM | POA: Insufficient documentation

## 2015-11-19 ENCOUNTER — Encounter: Payer: Self-pay | Admitting: Radiology

## 2015-11-19 ENCOUNTER — Emergency Department: Payer: Medicaid Other

## 2015-11-19 ENCOUNTER — Emergency Department
Admission: EM | Admit: 2015-11-19 | Discharge: 2015-11-19 | Disposition: A | Discharge status: Other Acute Inpatient Hospital | Payer: Medicaid Other | Attending: Emergency Medicine | Admitting: Emergency Medicine

## 2015-11-19 DIAGNOSIS — Z79899 Other long term (current) drug therapy: Secondary | ICD-10-CM | POA: Insufficient documentation

## 2015-11-19 DIAGNOSIS — R109 Unspecified abdominal pain: Secondary | ICD-10-CM

## 2015-11-19 DIAGNOSIS — K5130 Ulcerative (chronic) rectosigmoiditis without complications: Secondary | ICD-10-CM | POA: Diagnosis not present

## 2015-11-19 DIAGNOSIS — R1030 Lower abdominal pain, unspecified: Secondary | ICD-10-CM | POA: Diagnosis present

## 2015-11-19 DIAGNOSIS — K51219 Ulcerative (chronic) proctitis with unspecified complications: Secondary | ICD-10-CM

## 2015-11-19 DIAGNOSIS — M797 Fibromyalgia: Secondary | ICD-10-CM | POA: Insufficient documentation

## 2015-11-19 HISTORY — DX: Migraine, unspecified, not intractable, without status migrainosus: G43.909

## 2015-11-19 LAB — CBC
HCT: 39.6 % (ref 35.0–47.0)
Hemoglobin: 13.9 g/dL (ref 12.0–16.0)
MCH: 29.8 pg (ref 26.0–34.0)
MCHC: 35.2 g/dL (ref 32.0–36.0)
MCV: 84.7 fL (ref 80.0–100.0)
Platelets: 252 10*3/uL (ref 150–440)
RBC: 4.67 MIL/uL (ref 3.80–5.20)
RDW: 13.1 % (ref 11.5–14.5)
WBC: 9.6 10*3/uL (ref 3.6–11.0)

## 2015-11-19 LAB — URINALYSIS COMPLETE WITH MICROSCOPIC (ARMC ONLY)
Bilirubin Urine: NEGATIVE
Glucose, UA: NEGATIVE mg/dL
Ketones, ur: NEGATIVE mg/dL
Nitrite: NEGATIVE
Protein, ur: NEGATIVE mg/dL
Specific Gravity, Urine: 1.017 (ref 1.005–1.030)
pH: 6 (ref 5.0–8.0)

## 2015-11-19 LAB — WET PREP, GENITAL
Clue Cells Wet Prep HPF POC: NONE SEEN
Sperm: NONE SEEN
Trich, Wet Prep: NONE SEEN
Yeast Wet Prep HPF POC: NONE SEEN

## 2015-11-19 LAB — COMPREHENSIVE METABOLIC PANEL
ALT: 23 U/L (ref 14–54)
AST: 20 U/L (ref 15–41)
Albumin: 4.6 g/dL (ref 3.5–5.0)
Alkaline Phosphatase: 95 U/L (ref 38–126)
Anion gap: 3 — ABNORMAL LOW (ref 5–15)
BUN: 11 mg/dL (ref 6–20)
CO2: 26 mmol/L (ref 22–32)
Calcium: 9.3 mg/dL (ref 8.9–10.3)
Chloride: 109 mmol/L (ref 101–111)
Creatinine, Ser: 0.68 mg/dL (ref 0.44–1.00)
GFR calc Af Amer: >60 mL/min (ref >60)
GFR calc non Af Amer: >60 mL/min (ref >60)
Glucose, Bld: 96 mg/dL (ref 65–99)
Potassium: 3.6 mmol/L (ref 3.5–5.1)
Sodium: 138 mmol/L (ref 135–145)
Total Bilirubin: 0.7 mg/dL (ref 0.3–1.2)
Total Protein: 7.8 g/dL (ref 6.5–8.1)

## 2015-11-19 LAB — CHLAMYDIA/NGC RT PCR (ARMC ONLY)
Chlamydia Tr: NOT DETECTED
N gonorrhoeae: NOT DETECTED

## 2015-11-19 LAB — LIPASE, BLOOD: Lipase: 21 U/L (ref 11–51)

## 2015-11-19 MED ORDER — DIATRIZOATE MEGLUMINE & SODIUM 66-10 % PO SOLN
15.0000 mL | Freq: Once | ORAL | Status: AC
  Administered 2015-11-19: 15 mL via ORAL

## 2015-11-19 MED ORDER — OXYCODONE-ACETAMINOPHEN 5-325 MG PO TABS
1.0000 | ORAL_TABLET | ORAL | Status: DC | PRN
  Administered 2015-11-19: 1 via ORAL
  Filled 2015-11-19: qty 1

## 2015-11-19 MED ORDER — SODIUM CHLORIDE 0.9 % IV BOLUS (SEPSIS)
1000.0000 mL | Freq: Once | INTRAVENOUS | Status: AC
  Administered 2015-11-19: 1000 mL via INTRAVENOUS

## 2015-11-19 MED ORDER — MORPHINE SULFATE (PF) 4 MG/ML IV SOLN
4.0000 mg | Freq: Once | INTRAVENOUS | Status: AC
  Administered 2015-11-19: 4 mg via INTRAVENOUS

## 2015-11-19 MED ORDER — MORPHINE SULFATE (PF) 4 MG/ML IV SOLN
INTRAVENOUS | Status: AC
  Administered 2015-11-19: 4 mg via INTRAVENOUS
  Filled 2015-11-19: qty 1

## 2015-11-19 MED ORDER — MORPHINE SULFATE (PF) 4 MG/ML IV SOLN
4.0000 mg | Freq: Once | INTRAVENOUS | Status: AC
  Administered 2015-11-19: 4 mg via INTRAMUSCULAR
  Filled 2015-11-19: qty 1

## 2015-11-19 MED ORDER — IOPAMIDOL (ISOVUE-300) INJECTION 61%
100.0000 mL | Freq: Once | INTRAVENOUS | Status: AC | PRN
  Administered 2015-11-19: 100 mL via INTRAVENOUS

## 2015-11-19 NOTE — ED Notes (Addendum)
Lower abd pain x today. Pt reports via [facility] interpreter Rafel that she has been having some back pain and urinary frequency. Pt denies vaginal discharge.

## 2015-11-19 NOTE — ED Notes (Signed)
Patient transported to Ultrasound 

## 2015-11-19 NOTE — ED Provider Notes (Signed)
[facility] Emergency Department Provider Note   ____________________________________________  Time seen: Approximately 347 AM  I have reviewed the triage vital signs and the nursing notes.   HISTORY  Chief Complaint Abdominal Pain    HPI Lisa [name] is a 25 y.o. female who comes into the hospital today with some abdominal pain. She reports that the pain started this morning. She reports that the pain is also in her left back. She took some Tylenol at home and it did not help. The patient reports that she was given a pill when she arrived here and helped a little bit. The patient is also nauseous with no vomiting. She denies pain with urination and denies any blood in her urine. She reports that she has had this pain in the past. It is worse when she is sexually active and she reports that she was sexually active this morning. The patient's last menstrual period was 3 years ago but she is on Depo-Provera. She reports that sometimes she does have some vaginal discharge but her doctor has told her that it's normal. She reports that the discharge is clear. She's had no fever and she is a G3 P3. The patient does have a history of fibromyalgia but does not take any other medication for it. She is here for evaluation today. The patient rates her pain 8 out of 10 in intensity.   No past medical history on file.  There are no active problems to display for this patient.   Past Surgical History  Procedure Laterality Date  . Appendectomy    . Cholecystectomy      Current Outpatient Rx  Name  Route  Sig  Dispense  Refill  . levothyroxine (SYNTHROID, LEVOTHROID) 75 MCG tablet   Oral   Take 75 mcg by mouth every morning.           Allergies Amoxicillin  No family history on file.  Social History Social History  Substance Use Topics  . Smoking status: Never Smoker   . Smokeless tobacco: Not on file  . Alcohol Use: No    Review of  Systems Constitutional: No fever/chills Eyes: No visual changes. ENT: No sore throat. Cardiovascular: Denies chest pain. Respiratory: Denies shortness of breath. Gastrointestinal: abdominal pain, nausea, no vomiting.  No diarrhea.  No constipation. Genitourinary: Negative for dysuria. Musculoskeletal: Negative for back pain. Skin: Negative for rash. Neurological: Negative for headaches, focal weakness or numbness.  10-point ROS otherwise negative.  ____________________________________________   PHYSICAL EXAM:  VITAL SIGNS: ED Triage Vitals  Enc Vitals Group     BP 11/19/15 0101 103/70 mmHg     Pulse Rate 11/19/15 0101 78     Resp 11/19/15 0101 18     Temp 11/19/15 0101 98 F (36.7 C)     Temp Source 11/19/15 0101 Oral     SpO2 11/19/15 0101 100 %     Weight 11/19/15 0101 151 lb (68.493 kg)     Height --      Head Cir --      Peak Flow --      Pain Score 11/19/15 0103 9     Pain Loc --      Pain Edu? --      Excl. in GC? --     Constitutional: Alert and oriented. Well appearing and in Moderate distress. Eyes: Conjunctivae are normal. PERRL. EOMI. Head: Atraumatic. Nose: No congestion/rhinnorhea. Mouth/Throat: Mucous membranes are moist.  Oropharynx non-erythematous. Cardiovascular: Normal  rate, regular rhythm. Grossly normal heart sounds.  Good peripheral circulation. Respiratory: Normal respiratory effort.  No retractions. Lungs CTAB. Gastrointestinal: Soft With some lower abdominal tenderness to palpation. No distention. Positive bowel sounds Genitourinary: Normal external genitalia with some mild comfort Musculoskeletal: No lower extremity tenderness nor edema.   Neurologic:  Normal speech and language.  Skin:  Skin is warm, dry and intact.  Psychiatric: Mood and affect are normal.   ____________________________________________   LABS (all labs ordered are listed, but only abnormal results are displayed)  Labs Reviewed  WET PREP, GENITAL - Abnormal;  Notable for the following:    WBC, Wet Prep HPF POC FEW (*)    All other components within normal limits  COMPREHENSIVE METABOLIC PANEL - Abnormal; Notable for the following:    Anion gap 3 (*)    All other components within normal limits  URINALYSIS COMPLETEWITH MICROSCOPIC ([facility] ONLY) - Abnormal; Notable for the following:    Color, Urine YELLOW (*)    APPearance CLEAR (*)    Hgb urine dipstick 1+ (*)    Leukocytes, UA 1+ (*)    Bacteria, UA RARE (*)    Squamous Epithelial / LPF 0-5 (*)    All other components within normal limits  CHLAMYDIA/NGC RT PCR ([facility] ONLY)  LIPASE, BLOOD  CBC  POC URINE PREG, ED   ____________________________________________  EKG  None ____________________________________________  RADIOLOGY  US pelvis: Multiple loops of prominent stool-filled bowel, negative pelvic ultrasound ____________________________________________   PROCEDURES  Procedure(s) performed: None  Procedures  Critical Care performed: No  ____________________________________________   INITIAL IMPRESSION / ASSESSMENT AND PLAN / ED COURSE  Pertinent labs & imaging results that were available during my care of the patient were reviewed by me and considered in my medical decision making (see chart for details).  This is a 25 year old female who comes into the hospital today with some lower abdominal pain. The patient reports that the pain is worse when she has sexual intercourse. I will send the patient for an ultrasound and I will give her a dose of morphine IM.  The patient continues to have some pain although it is mildly improved. Since the patient's ultrasound is negative I will do a CT scan to evaluate her pain. The patient's care will be signed out to Dr. Darnelle CatalanMalinda who will follow up the results of the CT.   ____________________________________________   FINAL CLINICAL IMPRESSION(S) / ED DIAGNOSES  Final diagnoses:  Abdominal pain      NEW MEDICATIONS  STARTED DURING THIS VISIT:  New Prescriptions   No medications on file     Note:  This document was prepared using Dragon voice recognition software and may include unintentional dictation errors.    Rebecka ApleyAllison P Webster, MD 11/19/15 [phone]

## 2015-11-19 NOTE — ED Notes (Signed)
Interpreter paged.

## 2015-11-19 NOTE — ED Notes (Signed)
Patient transported to CT 

## 2015-11-19 NOTE — ED Notes (Signed)
MD at bedside. 

## 2015-11-19 NOTE — ED Notes (Signed)
POCT RESULTS WERE NEGATIVE 

## 2016-02-05 DIAGNOSIS — Z62810 Personal history of physical and sexual abuse in childhood: Secondary | ICD-10-CM | POA: Insufficient documentation

## 2017-08-04 ENCOUNTER — Other Ambulatory Visit: Payer: Self-pay

## 2017-08-04 DIAGNOSIS — Z79899 Other long term (current) drug therapy: Secondary | ICD-10-CM | POA: Insufficient documentation

## 2017-08-04 DIAGNOSIS — N12 Tubulo-interstitial nephritis, not specified as acute or chronic: Secondary | ICD-10-CM | POA: Insufficient documentation

## 2017-08-04 LAB — COMPREHENSIVE METABOLIC PANEL
ALT: 18 U/L (ref 14–54)
AST: 24 U/L (ref 15–41)
Albumin: 4.1 g/dL (ref 3.5–5.0)
Alkaline Phosphatase: 83 U/L (ref 38–126)
Anion gap: 9 (ref 5–15)
BUN: 16 mg/dL (ref 6–20)
CO2: 24 mmol/L (ref 22–32)
Calcium: 9 mg/dL (ref 8.9–10.3)
Chloride: 104 mmol/L (ref 101–111)
Creatinine, Ser: 0.54 mg/dL (ref 0.44–1.00)
GFR calc Af Amer: >60 mL/min (ref >60)
GFR calc non Af Amer: >60 mL/min (ref >60)
Glucose, Bld: 104 mg/dL — ABNORMAL HIGH (ref 65–99)
Potassium: 3.6 mmol/L (ref 3.5–5.1)
Sodium: 137 mmol/L (ref 135–145)
Total Bilirubin: 0.4 mg/dL (ref 0.3–1.2)
Total Protein: 7.4 g/dL (ref 6.5–8.1)

## 2017-08-04 LAB — URINALYSIS, COMPLETE (UACMP) WITH MICROSCOPIC
Bacteria, UA: NONE SEEN
Bilirubin Urine: NEGATIVE
Glucose, UA: NEGATIVE mg/dL
Hgb urine dipstick: NEGATIVE
Ketones, ur: NEGATIVE mg/dL
Leukocytes, UA: NEGATIVE
Nitrite: NEGATIVE
Protein, ur: NEGATIVE mg/dL
Specific Gravity, Urine: 1.017 (ref 1.005–1.030)
pH: 6 (ref 5.0–8.0)

## 2017-08-04 LAB — CBC
HCT: 35.8 % (ref 35.0–47.0)
Hemoglobin: 11.9 g/dL — ABNORMAL LOW (ref 12.0–16.0)
MCH: 28.4 pg (ref 26.0–34.0)
MCHC: 33.3 g/dL (ref 32.0–36.0)
MCV: 85.3 fL (ref 80.0–100.0)
Platelets: 304 10*3/uL (ref 150–440)
RBC: 4.2 MIL/uL (ref 3.80–5.20)
RDW: 14.5 % (ref 11.5–14.5)
WBC: 9.5 10*3/uL (ref 3.6–11.0)

## 2017-08-04 LAB — LIPASE, BLOOD: Lipase: 34 U/L (ref 11–51)

## 2017-08-04 LAB — POCT PREGNANCY, URINE: Preg Test, Ur: NEGATIVE

## 2017-08-04 NOTE — ED Triage Notes (Signed)
Pt arrives to ED via POV from home with c/o migraine x3 days and LEFT flank pain x2 days. Pt reports pain with urination, states decrease in frequency. Pt endorses N/V. Pt reports taking Excedrin for the HA yesterday, but nothing taken today. Pt is A&O, in NAD; RR even, regular, and unlabored.

## 2017-08-05 ENCOUNTER — Emergency Department
Admission: EM | Admit: 2017-08-05 | Discharge: 2017-08-05 | Disposition: A | Discharge status: Home / Self Care | Payer: Self-pay | Attending: Emergency Medicine | Admitting: Emergency Medicine

## 2017-08-05 ENCOUNTER — Emergency Department: Payer: Self-pay

## 2017-08-05 DIAGNOSIS — N12 Tubulo-interstitial nephritis, not specified as acute or chronic: Secondary | ICD-10-CM

## 2017-08-05 MED ORDER — KETOROLAC TROMETHAMINE 30 MG/ML IJ SOLN
30.0000 mg | Freq: Once | INTRAMUSCULAR | Status: AC
  Administered 2017-08-05: 30 mg via INTRAMUSCULAR
  Filled 2017-08-05: qty 1

## 2017-08-05 MED ORDER — CIPROFLOXACIN HCL 500 MG PO TABS
500.0000 mg | ORAL_TABLET | Freq: Once | ORAL | Status: AC
  Administered 2017-08-05: 500 mg via ORAL
  Filled 2017-08-05: qty 1

## 2017-08-05 MED ORDER — CIPROFLOXACIN HCL 500 MG PO TABS: 500.0000 mg | ORAL_TABLET | Freq: Two times a day (BID) | ORAL | 0 refills | Status: AC

## 2017-08-05 MED ORDER — HYDROCODONE-ACETAMINOPHEN 5-325 MG PO TABS
2.0000 | ORAL_TABLET | Freq: Once | ORAL | Status: AC
  Administered 2017-08-05: 2 via ORAL
  Filled 2017-08-05: qty 2

## 2017-08-05 MED ORDER — HYDROCODONE-ACETAMINOPHEN 5-325 MG PO TABS: 1.0000 | ORAL_TABLET | Freq: Four times a day (QID) | ORAL | 0 refills | Status: DC | PRN

## 2017-08-05 MED ORDER — IBUPROFEN 600 MG PO TABS: 600.0000 mg | ORAL_TABLET | Freq: Three times a day (TID) | ORAL | 0 refills | Status: DC | PRN

## 2017-08-05 NOTE — ED Notes (Signed)
Patient is resting comfortably. 

## 2017-08-05 NOTE — ED Notes (Signed)
Interpreter on a stick used for assessment

## 2017-08-05 NOTE — ED Notes (Signed)
Attempted to call patient to let her know that she left her shirt here.

## 2017-08-05 NOTE — Discharge Instructions (Signed)
Please take all of your antibiotics as prescribed and follow-up with your primary care physician in 2 days for recheck.  Return to the emergency department sooner for any new or worsening symptoms such as fevers, chills, worsening pain, if you cannot eat or drink, or for any other issues whatsoever.  It was a pleasure to take care of you today, and thank you for coming to our emergency department.  If you have any questions or concerns before leaving please ask the nurse to grab me and I'm more than happy to go through your aftercare instructions again.  If you were prescribed any opioid pain medication today such as Norco, Vicodin, Percocet, morphine, hydrocodone, or oxycodone please make sure you do not drive when you are taking this medication as it can alter your ability to drive safely.  If you have any concerns once you are home that you are not improving or are in fact getting worse before you can make it to your follow-up appointment, please do not hesitate to call [phone] and come back for further evaluation.  Merrily BrittleNeil Lurene Robley, MD  Results for orders placed or performed during the hospital encounter of 08/05/17  Lipase, blood  Result Value Ref Range   Lipase 34 11 - 51 U/L  Comprehensive metabolic panel  Result Value Ref Range   Sodium 137 135 - 145 mmol/L   Potassium 3.6 3.5 - 5.1 mmol/L   Chloride 104 101 - 111 mmol/L   CO2 24 22 - 32 mmol/L   Glucose, Bld 104 (H) 65 - 99 mg/dL   BUN 16 6 - 20 mg/dL   Creatinine, Ser 6.040.54 0.44 - 1.00 mg/dL   Calcium 9.0 8.9 - 54.010.3 mg/dL   Total Protein 7.4 6.5 - 8.1 g/dL   Albumin 4.1 3.5 - 5.0 g/dL   AST 24 15 - 41 U/L   ALT 18 14 - 54 U/L   Alkaline Phosphatase 83 38 - 126 U/L   Total Bilirubin 0.4 0.3 - 1.2 mg/dL   GFR calc non Af Amer >60 >60 mL/min   GFR calc Af Amer >60 >60 mL/min   Anion gap 9 5 - 15  CBC  Result Value Ref Range   WBC 9.5 3.6 - 11.0 K/uL   RBC 4.20 3.80 - 5.20 MIL/uL   Hemoglobin 11.9 (L) 12.0 - 16.0 g/dL   HCT 98.135.8  19.135.0 - 47.847.0 %   MCV 85.3 80.0 - 100.0 fL   MCH 28.4 26.0 - 34.0 pg   MCHC 33.3 32.0 - 36.0 g/dL   RDW 29.514.5 62.111.5 - 30.814.5 %   Platelets 304 150 - 440 K/uL  Urinalysis, Complete w Microscopic  Result Value Ref Range   Color, Urine YELLOW (A) YELLOW   APPearance CLEAR (A) CLEAR   Specific Gravity, Urine 1.017 1.005 - 1.030   pH 6.0 5.0 - 8.0   Glucose, UA NEGATIVE NEGATIVE mg/dL   Hgb urine dipstick NEGATIVE NEGATIVE   Bilirubin Urine NEGATIVE NEGATIVE   Ketones, ur NEGATIVE NEGATIVE mg/dL   Protein, ur NEGATIVE NEGATIVE mg/dL   Nitrite NEGATIVE NEGATIVE   Leukocytes, UA NEGATIVE NEGATIVE   RBC / HPF 0-5 0 - 5 RBC/hpf   WBC, UA 0-5 0 - 5 WBC/hpf   Bacteria, UA NONE SEEN NONE SEEN   Squamous Epithelial / LPF 0-5 (A) NONE SEEN   Mucus PRESENT    Hyaline Casts, UA PRESENT   Pregnancy, urine POC  Result Value Ref Range   Preg Test, Ur NEGATIVE  NEGATIVE   Ct Renal Stone Study  Result Date: 08/05/2017 CLINICAL DATA:  27 year old female with left-sided flank pain. Concern for kidney stone. EXAM: CT ABDOMEN AND PELVIS WITHOUT CONTRAST TECHNIQUE: Multidetector CT imaging of the abdomen and pelvis was performed following the standard protocol without IV contrast. COMPARISON:  Abdominal CT dated 11/19/2015 FINDINGS: Evaluation of this exam is limited in the absence of intravenous contrast. Lower chest: The visualized lung bases are clear. There is hypoattenuation of the cardiac blood pool suggestive of a degree of anemia. Clinical correlation is recommended. No intra-abdominal free air or free fluid. Hepatobiliary: Cholecystectomy. The liver is unremarkable. No intrahepatic biliary ductal dilatation. Pancreas: Unremarkable. No pancreatic ductal dilatation or surrounding inflammatory changes. Spleen: Normal in size without focal abnormality. Adrenals/Urinary Tract: The adrenal glands are unremarkable. There is a punctate nonobstructing right renal upper pole calculus. No hydronephrosis. The left kidney  is unremarkable. The visualized ureters and urinary bladder appear unremarkable. Stomach/Bowel: Moderate stool throughout the colon. No bowel obstruction or active inflammation. Appendectomy. Vascular/Lymphatic: No significant vascular findings are present. No enlarged abdominal or pelvic lymph nodes. Reproductive: The uterus is grossly unremarkable. There is a 3.5 cm right ovarian cyst/cystic lesion. Ultrasound may provide better evaluation. Other: None Musculoskeletal: No acute or significant osseous findings. IMPRESSION: Punctate nonobstructing right renal upper pole calculus. No hydronephrosis. A 3.5 cm right ovarian cyst. Ultrasound may provide better evaluation. No bowel obstruction or active inflammation. Electronically Signed   By: Elgie Collard M.D.   On: 08/05/2017 02:58

## 2017-08-05 NOTE — ED Provider Notes (Signed)
[facility] Emergency Department Provider Note  ____________________________________________   First MD Initiated Contact with Patient 08/05/17 0147     (approximate)  I have reviewed the triage vital signs and the nursing notes.   HISTORY  Chief Complaint No chief complaint on file.    HPI Lisa [name] is a 27 y.o. female who self presents to the emergency department with 2 days of gradual onset left flank throbbing discomfortz associated with dysuria and frequency.  She reports nausea and occasional vomiting.  She also has roughly 3 days of gradual onset not maximal onset bilateral throbbing frontal headache similar to previous headaches.  She denies fevers or chills.  She denies history of renal colic.  She has a previous history of appendectomy and cholecystectomy.  Her pain is currently moderate severity left flank nonradiating.  Nothing seems to make it better and is clearly worse with urination.  Past Medical History:  Diagnosis Date  . Migraines     There are no active problems to display for this patient.   Past Surgical History:  Procedure Laterality Date  . APPENDECTOMY    . CHOLECYSTECTOMY      Prior to Admission medications   Medication Sig Start Date End Date Taking? Authorizing Provider  ciprofloxacin (CIPRO) 500 MG tablet Take 1 tablet (500 mg total) by mouth 2 (two) times daily for 10 days. 08/05/17 08/15/17  Merrily Brittle, MD  HYDROcodone-acetaminophen (NORCO) 5-325 MG tablet Take 1 tablet by mouth every 6 (six) hours as needed for up to 7 doses for severe pain. 08/05/17   Merrily Brittle, MD  ibuprofen (ADVIL,MOTRIN) 600 MG tablet Take 1 tablet (600 mg total) by mouth every 8 (eight) hours as needed. 08/05/17   Merrily Brittle, MD  levothyroxine (SYNTHROID, LEVOTHROID) 75 MCG tablet Take 75 mcg by mouth every morning. 07/22/15   [provider]    Allergies Amoxicillin  No family history on file.  Social  History Social History   Tobacco Use  . Smoking status: Never Smoker  . Smokeless tobacco: Never Used  Substance Use Topics  . Alcohol use: No  . Drug use: No    Review of Systems Constitutional: No fever/chills Eyes: No visual changes. ENT: No sore throat. Cardiovascular: Denies chest pain. Respiratory: Denies shortness of breath. Gastrointestinal: Positive for abdominal pain.  Positive for nausea, positive for vomiting.  No diarrhea.  No constipation. Genitourinary: Negative for dysuria. Musculoskeletal: Negative for back pain. Skin: Negative for rash. Neurological: Positive for headache   ____________________________________________   PHYSICAL EXAM:  VITAL SIGNS: ED Triage Vitals  Enc Vitals Group     BP 08/04/17 2244 (!) 93/59     Pulse Rate 08/04/17 2244 74     Resp 08/04/17 2244 20     Temp 08/04/17 2244 98 F (36.7 C)     Temp Source 08/04/17 2244 Oral     SpO2 08/04/17 2244 98 %     Weight 08/04/17 2256 151 lb (68.5 kg)     Height --      Head Circumference --      Peak Flow --      Pain Score 08/04/17 2256 8     Pain Loc --      Pain Edu? --      Excl. in GC? --     Constitutional: Alert and oriented x4 pleasant cooperative speaks in full clear sentences no diaphoresis Eyes: PERRL EOMI. Head: Atraumatic. Nose: No congestion/rhinnorhea. Mouth/Throat: No trismus Neck:  No stridor.   Cardiovascular: Normal rate, regular rhythm. Grossly normal heart sounds.  Good peripheral circulation. Respiratory: Normal respiratory effort.  No retractions. Lungs CTAB and moving good air Gastrointestinal: Soft nondistended nontender no rebound or guarding no peritonitis some left-sided costovertebral tenderness greater than right side Musculoskeletal: No lower extremity edema   Neurologic:  Normal speech and language. No gross focal neurologic deficits are appreciated. Skin:  Skin is warm, dry and intact. No rash noted. Psychiatric: Mood and affect are normal. Speech  and behavior are normal.    ____________________________________________   DIFFERENTIAL includes but not limited to  Renal colic, pyelonephritis, infected stone, small bowel obstruction ____________________________________________   LABS (all labs ordered are listed, but only abnormal results are displayed)  Labs Reviewed  COMPREHENSIVE METABOLIC PANEL - Abnormal; Notable for the following components:      Result Value   Glucose, Bld 104 (*)    All other components within normal limits  CBC - Abnormal; Notable for the following components:   Hemoglobin 11.9 (*)    All other components within normal limits  URINALYSIS, COMPLETE (UACMP) WITH MICROSCOPIC - Abnormal; Notable for the following components:   Color, Urine YELLOW (*)    APPearance CLEAR (*)    Squamous Epithelial / LPF 0-5 (*)    All other components within normal limits  URINE CULTURE  LIPASE, BLOOD  POC URINE PREG, ED  POCT PREGNANCY, URINE    Lab work reviewed by me with no acute disease __________________________________________  EKG   ____________________________________________  RADIOLOGY  CT stone reviewed by me with no acute disease ____________________________________________   PROCEDURES  Procedure(s) performed: no  Procedures  Critical Care performed: no  Observation: no ____________________________________________   INITIAL IMPRESSION / ASSESSMENT AND PLAN / ED COURSE  Pertinent labs & imaging results that were available during my care of the patient were reviewed by me and considered in my medical decision making (see chart for details).  The patient arrives uncomfortable appearing with left flank pain.  Lab work is reassuring and no evidence of obvious UTI on urinalysis despite her symptoms.  CT stone is pending.     ----------------------------------------- 3:36 AM on 08/05/2017 -----------------------------------------  The patient's pain is improved.  CT scan is  reassuring with no obstructive nephropathy although no clear etiology of her symptoms are identified either.  She does report dysuria and frequency along with left flank pain and did have some costovertebral tenderness so it is certainly possible she has pyelonephritis despite her relatively normal urine.  I think is most reasonable to treat her with antibiotics now and I will send a culture and call back. ____________________________________________   FINAL CLINICAL IMPRESSION(S) / ED DIAGNOSES  Final diagnoses:  Pyelonephritis      NEW MEDICATIONS STARTED DURING THIS VISIT:  Discharge Medication List as of 08/05/2017  3:36 AM    START taking these medications   Details  ciprofloxacin (CIPRO) 500 MG tablet Take 1 tablet (500 mg total) by mouth 2 (two) times daily for 10 days., Starting Tue 08/05/2017, Until Fri 08/15/2017, Print    HYDROcodone-acetaminophen (NORCO) 5-325 MG tablet Take 1 tablet by mouth every 6 (six) hours as needed for up to 7 doses for severe pain., Starting Tue 08/05/2017, Print    ibuprofen (ADVIL,MOTRIN) 600 MG tablet Take 1 tablet (600 mg total) by mouth every 8 (eight) hours as needed., Starting Tue 08/05/2017, Print         Note:  This document was prepared using Dragon voice  recognition software and may include unintentional dictation errors.     Merrily Brittle, MD 08/06/17 [phone]

## 2017-08-06 LAB — URINE CULTURE: Culture: NO GROWTH

## 2018-04-13 ENCOUNTER — Emergency Department
Admission: EM | Admit: 2018-04-13 | Discharge: 2018-04-13 | Disposition: A | Discharge status: Home / Self Care | Payer: PRIVATE HEALTH INSURANCE | Attending: Emergency Medicine | Admitting: Emergency Medicine

## 2018-04-13 ENCOUNTER — Encounter: Payer: Self-pay | Admitting: Medical Oncology

## 2018-04-13 DIAGNOSIS — M5432 Sciatica, left side: Secondary | ICD-10-CM | POA: Diagnosis not present

## 2018-04-13 DIAGNOSIS — S39012A Strain of muscle, fascia and tendon of lower back, initial encounter: Secondary | ICD-10-CM

## 2018-04-13 DIAGNOSIS — M549 Dorsalgia, unspecified: Secondary | ICD-10-CM | POA: Diagnosis present

## 2018-04-13 LAB — URINALYSIS, COMPLETE (UACMP) WITH MICROSCOPIC
Bacteria, UA: NONE SEEN
Bilirubin Urine: NEGATIVE
Glucose, UA: NEGATIVE mg/dL
Hgb urine dipstick: NEGATIVE
Ketones, ur: NEGATIVE mg/dL
Leukocytes, UA: NEGATIVE
Nitrite: NEGATIVE
Protein, ur: NEGATIVE mg/dL
Specific Gravity, Urine: 1.019 (ref 1.005–1.030)
pH: 6 (ref 5.0–8.0)

## 2018-04-13 LAB — POCT PREGNANCY, URINE: Preg Test, Ur: NEGATIVE

## 2018-04-13 MED ORDER — KETOROLAC TROMETHAMINE 30 MG/ML IJ SOLN
30.0000 mg | Freq: Once | INTRAMUSCULAR | Status: AC
  Administered 2018-04-13: 30 mg via INTRAMUSCULAR
  Filled 2018-04-13: qty 1

## 2018-04-13 MED ORDER — KETOROLAC TROMETHAMINE 10 MG PO TABS: 10.0000 mg | ORAL_TABLET | Freq: Four times a day (QID) | ORAL | 0 refills | Status: DC | PRN

## 2018-04-13 MED ORDER — METHOCARBAMOL 500 MG PO TABS: 500.0000 mg | ORAL_TABLET | Freq: Four times a day (QID) | ORAL | 0 refills | Status: DC | PRN

## 2018-04-13 NOTE — ED Notes (Signed)
See triage note  Presents with left flank and lower back pain  States pain started about 1 week ago  Min relief with IBU

## 2018-04-13 NOTE — ED Triage Notes (Signed)
Pain in left flank area.

## 2018-04-13 NOTE — Discharge Instructions (Signed)
Get prescriptions from the pharmacy and begin taking as directed.  Robaxin may cause drowsiness.  Do not take and drive.  You may take Toradol every 6 hours with food for inflammation and pain. Use ice or heat to your back as needed. [facility] if any continued problems.

## 2018-04-13 NOTE — ED Notes (Signed)
Interpreter requested 

## 2018-04-13 NOTE — ED Triage Notes (Addendum)
Using interpreter: pt reports left sided back pain/buttock pain that radiates into her left leg and pt also reports aching to arms x 1 week. Pt denies injury. Denies dysuria

## 2018-04-13 NOTE — ED Provider Notes (Signed)
[facility] Emergency Department Provider Note  ____________________________________________   First MD Initiated Contact with Patient 04/13/18 1329     (approximate)  I have reviewed the triage vital signs and the nursing notes.   HISTORY  Chief Complaint Back Pain Spanish interpreter  HPI Lisa [name] is a 27 y.o. female patient presents to the ED with complaint of left-sided back pain that radiates into her buttocks and goes down her left leg.  Patient reports that there has been no history of any injury.  She denies any previous back pain.  She states that she was told in the past that she had "tiny kidney stones".  She denies any hematuria, frequency or dysuria.  She is taken ibuprofen infrequently with minimal improvement.  She denies any saddle anesthesias or incontinence of bowel or bladder.  Currently she rates her pain as an 8 out of 10.   Past Medical History:  Diagnosis Date  . Migraines     There are no active problems to display for this patient.   Past Surgical History:  Procedure Laterality Date  . APPENDECTOMY    . CHOLECYSTECTOMY      Prior to Admission medications   Medication Sig Start Date End Date Taking? Authorizing Provider  ketorolac (TORADOL) 10 MG tablet Take 1 tablet (10 mg total) by mouth every 6 (six) hours as needed for moderate pain. 04/13/18   Tommi Rumps, PA-C  levothyroxine (SYNTHROID, LEVOTHROID) 75 MCG tablet Take 75 mcg by mouth every morning. 07/22/15   [provider]  methocarbamol (ROBAXIN) 500 MG tablet Take 1 tablet (500 mg total) by mouth every 6 (six) hours as needed. 04/13/18   Tommi Rumps, PA-C    Allergies Amoxicillin and Latex  No family history on file.  Social History Social History   Tobacco Use  . Smoking status: Never Smoker  . Smokeless tobacco: Never Used  Substance Use Topics  . Alcohol use: No  . Drug use: No    Review of Systems Constitutional:  No fever/chills Eyes: No visual changes. ENT: No sore throat. Cardiovascular: Denies chest pain. Respiratory: Denies shortness of breath. Gastrointestinal: No abdominal pain.  No nausea, no vomiting.  No diarrhea.  No constipation. Genitourinary: Negative for dysuria. Musculoskeletal: Positive for back pain and left leg pain radiculopathy. Skin: Negative for rash. Neurological: Negative for headaches, focal weakness or numbness. ____________________________________________   PHYSICAL EXAM:  VITAL SIGNS: ED Triage Vitals  Enc Vitals Group     BP 04/13/18 1312 99/65     Pulse Rate 04/13/18 1312 99     Resp 04/13/18 1312 18     Temp 04/13/18 1312 98.1 F (36.7 C)     Temp Source 04/13/18 1312 Oral     SpO2 04/13/18 1312 100 %     Weight 04/13/18 1313 140 lb (63.5 kg)     Height 04/13/18 1313 5\' 2"  (1.575 m)     Head Circumference --      Peak Flow --      Pain Score 04/13/18 1313 8     Pain Loc --      Pain Edu? --      Excl. in GC? --    Constitutional: Alert and oriented. Well appearing and in no acute distress. Eyes: Conjunctivae are normal.  Head: Atraumatic. Neck: No stridor.   Cardiovascular: Normal rate, regular rhythm. Grossly normal heart sounds.  Good peripheral circulation. Respiratory: Normal respiratory effort.  No retractions. Lungs CTAB.  Gastrointestinal: Soft and nontender. No distention. No CVA tenderness. Musculoskeletal: Moves upper extremities with any difficulty.  On examination of the back there is no point tenderness on palpation of the thoracic or lumbar spine.  There is however soft tissue tenderness left lateral paravertebral muscles and thoracic area.  No evidence of injury or discoloration is noted to the skin.  Straight leg raises were negative.  Good muscle strength bilaterally. Neurologic:  Normal speech and language. No gross focal neurologic deficits are appreciated.  Reflexes are equal bilaterally. Skin:  Skin is warm, dry and intact. No rash  noted. Psychiatric: Mood and affect are normal. Speech and behavior are normal.  ____________________________________________   LABS (all labs ordered are listed, but only abnormal results are displayed)  Labs Reviewed  URINALYSIS, COMPLETE (UACMP) WITH MICROSCOPIC - Abnormal; Notable for the following components:      Result Value   Color, Urine YELLOW (*)    APPearance HAZY (*)    All other components within normal limits  POC URINE PREG, ED  POCT PREGNANCY, URINE    PROCEDURES  Procedure(s) performed: None  Procedures  Critical Care performed: No  ____________________________________________   INITIAL IMPRESSION / ASSESSMENT AND PLAN / ED COURSE  As part of my medical decision making, I reviewed the following data within the electronic MEDICAL RECORD NUMBER Notes from prior ED visits and [place] Controlled Substance Database  Patient presents to the ED with complaint of left lower back pain with radiation into her left leg.  Patient denies any history of injury per Spanish interpreter.  Urinalysis was negative and patient was made aware.  On exam patient is moderately tender on palpation of the paravertebral muscles lumbar spine and SI joint on the left.  Patient was given Toradol 30 mg IM.  She is also given a prescription for Robaxin 500 mg every 6 hours for the next several days with the understanding that this may cause drowsiness and she should not drive while taking this medication.  She also will continue with Toradol 10 mg tablets every 6 hours with food for inflammation and pain.  She is to follow-up with her PCP if any continued problems.  ____________________________________________   FINAL CLINICAL IMPRESSION(S) / ED DIAGNOSES  Final diagnoses:  Strain of lumbar region, initial encounter  Sciatica of left side     ED Discharge Orders         Ordered    methocarbamol (ROBAXIN) 500 MG tablet  Every 6 hours PRN     04/13/18 1517    ketorolac (TORADOL) 10 MG tablet   Every 6 hours PRN     04/13/18 1517           Note:  This document was prepared using Dragon voice recognition software and may include unintentional dictation errors.    Tommi RumpsSummers, Kanon Novosel L, PA-C 04/13/18 1635    Emily FilbertWilliams, Jonathan E, MD 04/14/18 [id number]

## 2018-05-19 LAB — HM HIV SCREENING LAB: HM HIV Screening: NEGATIVE

## 2018-05-19 LAB — HM PAP SMEAR

## 2018-06-10 ENCOUNTER — Ambulatory Visit: Payer: Self-pay | Attending: Oncology | Admitting: *Deleted

## 2018-06-10 ENCOUNTER — Encounter (INDEPENDENT_AMBULATORY_CARE_PROVIDER_SITE_OTHER): Payer: Self-pay

## 2018-06-10 ENCOUNTER — Encounter: Payer: Self-pay | Admitting: Obstetrics and Gynecology

## 2018-06-10 ENCOUNTER — Other Ambulatory Visit: Payer: Self-pay | Admitting: Obstetrics and Gynecology

## 2018-06-10 ENCOUNTER — Ambulatory Visit (INDEPENDENT_AMBULATORY_CARE_PROVIDER_SITE_OTHER): Payer: Self-pay | Admitting: Obstetrics and Gynecology

## 2018-06-10 ENCOUNTER — Encounter: Payer: Self-pay | Admitting: *Deleted

## 2018-06-10 VITALS — BP 109/77 | HR 75 | Temp 97.6°F | Ht 63.25 in | Wt 144.4 lb

## 2018-06-10 VITALS — BP 105/71 | HR 80 | Ht 62.0 in | Wt 143.3 lb

## 2018-06-10 DIAGNOSIS — R87612 Low grade squamous intraepithelial lesion on cytologic smear of cervix (LGSIL): Secondary | ICD-10-CM

## 2018-06-10 NOTE — Progress Notes (Signed)
Referring Provider:  [facility]  HPI:  Lisa [name] is a 28 y.o.  No obstetric history on file.  who presents today for evaluation and management of abnormal cervical cytology.    Dysplasia History:  LGSIL Patient describes a remote history of previous colposcopy in 2011.  She does not recall any treatment after that colpo. ROS:  Pertinent items are noted in HPI.  OB History  No obstetric history on file.    Past Medical History:  Diagnosis Date  . Migraines     Past Surgical History:  Procedure Laterality Date  . APPENDECTOMY    . CHOLECYSTECTOMY      SOCIAL HISTORY: Social History   Substance and Sexual Activity  Alcohol Use No   Social History   Substance and Sexual Activity  Drug Use No     History reviewed. No pertinent family history.  ALLERGIES:  Amoxicillin and Latex  She has a current medication list which includes the following prescription(s): ketorolac, levothyroxine, and methocarbamol.  Physical Exam: -Vitals:  BP 105/71   Pulse 80   Ht 5\' 2"  (1.575 m)   Wt 143 lb 4.8 oz (65 kg)   BMI 26.21 kg/m    PROCEDURE: 1.  Urine Pregnancy Test:  not done 2.  Colposcopy performed with 4% acetic acid after verbal consent obtained                           -Aceto-white Lesions Location(s): 8 and 12 o'clock.              -Biopsy performed at 8 and 12 o'clock               -ECC indicated and performed: No.     -Biopsy sites made hemostatic with pressure and Monsel's solution   -Satisfactory colposcopy: Yes.      -Evidence of Invasive cervical CA :  NO  ASSESSMENT:  Lisa [name] is a 28 y.o. No obstetric history on file. here for  1. Low grade squamous intraepithelial lesion on cytologic smear of cervix (LGSIL)   .  PLAN: 1.  I discussed the grading system of pap smears and HPV high risk viral types.  We will discuss management after colpo results return.  No orders of the defined types were placed in this encounter.           F/U  Return in about 10 days (around 06/20/2018) for Colpo f/u.  Brennan Bailey ,MD 06/10/2018,12:48 PM

## 2018-06-10 NOTE — Progress Notes (Signed)
Patient comes in today to have [facility] colposcopy.

## 2018-06-10 NOTE — Progress Notes (Addendum)
28 year old Hispanic female referred to [facility] by [facility] for financial assistance for further evaluation of a recent abnormal pap of LGSIL.  Lloyda, the interpreter present during the interview and exam.  Patient was very evasive with answering screening questions, and at times the answers did not make sense.  The CMA, Efraim KaufmannMelissa was very concerned with the patient's demeanor and answers.  Asked the patient some of the same questions regarding housing and income verification.  States she is given a room.  States she is seperated .  She has 3 children who live with her in this room.  Asked if she felt safe in her current environment, and she states yes.  Explained that we were her for her and to let us know if she felt unsafe or had concerns.  Reviewed pap results and need for colposcopy and possible biopsy.  States [facility].  There are labs in Epic from [facility] from 2012 with pap of ASCUS and biopsy results of CIN1.  Patient is being referred to Dr. Logan BoresEvans at [facility].  She has an appointment today for colposcopy and possible biopsy.  If she needs further treatment, we will need to apply for charity care since she is not a [place] citizen.  Will follow-up per [facility] protocol.

## 2018-06-15 LAB — PATHOLOGY

## 2018-06-19 ENCOUNTER — Encounter: Payer: Self-pay | Admitting: Obstetrics and Gynecology

## 2018-06-29 ENCOUNTER — Encounter: Payer: Self-pay | Admitting: *Deleted

## 2018-06-29 NOTE — Progress Notes (Signed)
Tried to call patient via the [employer] interpreter [place] [phone].  No answer and no voicemail set up.  Left message with her emergency contact Mable Paris # [phone] to have the patient return my call.  Patient has an abnormal biopsy result.  Will try call back later this week if no response.

## 2018-06-30 ENCOUNTER — Encounter: Payer: Self-pay | Admitting: *Deleted

## 2018-06-30 NOTE — Progress Notes (Signed)
Patient returned my call today.  Lloyda, the interpreter assisted with interpretation.  Reviewed cervical biopsy results.  Discussed need to see Dr.  Logan Bores to discuss her results and further treatment if needed, or close follow-up.  I have scheduled her to see him tomorrow at 9:15.  Patient is not a [place] Citizen, so I cannot get her [facility] Medicaid.  She will need to complete financial assistant paperwork if a LEEP is recommended.

## 2018-07-01 ENCOUNTER — Ambulatory Visit (INDEPENDENT_AMBULATORY_CARE_PROVIDER_SITE_OTHER): Payer: Self-pay | Admitting: Obstetrics and Gynecology

## 2018-07-07 ENCOUNTER — Ambulatory Visit (INDEPENDENT_AMBULATORY_CARE_PROVIDER_SITE_OTHER): Payer: Self-pay | Admitting: Obstetrics and Gynecology

## 2018-07-07 ENCOUNTER — Encounter: Payer: Self-pay | Admitting: Obstetrics and Gynecology

## 2018-07-07 VITALS — BP 110/77 | HR 75 | Wt 141.0 lb

## 2018-07-07 DIAGNOSIS — N871 Moderate cervical dysplasia: Secondary | ICD-10-CM

## 2018-07-07 NOTE — Progress Notes (Signed)
Patient comes in for colpo results.

## 2018-07-07 NOTE — Progress Notes (Signed)
HPI:      [name] is a 28 y.o. No obstetric history on file. who LMP was No LMP recorded. Patient has had an injection.  Subjective:   She presents today to discuss her colposcopy results.  She had an LGSIL Pap smear and CIN-2 was noted on her upper scopic Lee directed biopsies. She describes a remote history of previous colposcopy in 2011.  She does not recall receiving any treatment after that.    Hx: The following portions of the patient's history were reviewed and updated as appropriate:             She  has a past medical history of Migraines. She does not have a problem list on file. She  has a past surgical history that includes Appendectomy and Cholecystectomy. Her family history is not on file. She  reports that she has never smoked. She has never used smokeless tobacco. She reports that she does not drink alcohol or use drugs. She has a current medication list which includes the following prescription(s): ketorolac, levothyroxine, and methocarbamol. She is allergic to amoxicillin and latex.       Review of Systems:  Review of Systems  Constitutional: Denied constitutional symptoms, night sweats, recent illness, fatigue, fever, insomnia and weight loss.  Eyes: Denied eye symptoms, eye pain, photophobia, vision change and visual disturbance.  Ears/Nose/Throat/Neck: Denied ear, nose, throat or neck symptoms, hearing loss, nasal discharge, sinus congestion and sore throat.  Cardiovascular: Denied cardiovascular symptoms, arrhythmia, chest pain/pressure, edema, exercise intolerance, orthopnea and palpitations.  Respiratory: Denied pulmonary symptoms, asthma, pleuritic pain, productive sputum, cough, dyspnea and wheezing.  Gastrointestinal: Denied, gastro-esophageal reflux, melena, nausea and vomiting.  Genitourinary: Denied genitourinary symptoms including symptomatic vaginal discharge, pelvic relaxation issues, and urinary complaints.  Musculoskeletal: Denied  musculoskeletal symptoms, stiffness, swelling, muscle weakness and myalgia.  Dermatologic: Denied dermatology symptoms, rash and scar.  Neurologic: Denied neurology symptoms, dizziness, headache, neck pain and syncope.  Psychiatric: Denied psychiatric symptoms, anxiety and depression.  Endocrine: Denied endocrine symptoms including hot flashes and night sweats.   Meds:   Current Outpatient Medications on File Prior to Visit  Medication Sig Dispense Refill  . ketorolac (TORADOL) 10 MG tablet Take 1 tablet (10 mg total) by mouth every 6 (six) hours as needed for moderate pain. (Patient not taking: Reported on 06/10/2018) 12 tablet 0  . levothyroxine (SYNTHROID, LEVOTHROID) 75 MCG tablet Take 75 mcg by mouth every morning.    . methocarbamol (ROBAXIN) 500 MG tablet Take 1 tablet (500 mg total) by mouth every 6 (six) hours as needed. (Patient not taking: Reported on 06/10/2018) 15 tablet 0   No current facility-administered medications on file prior to visit.     Objective:     Vitals:   07/07/18 0856  BP: 110/77  Pulse: 75              Colposcopically directed biopsies discussed in detail with the patient  Assessment:    No obstetric history on file. There are no active problems to display for this patient.    1. CIN II (cervical intraepithelial neoplasia II)        Plan:            1.  We have discussed the possibility of LEEP versus expectant management.  The risks and benefits of each were reviewed in detail.  All questions answered.  Patient has decided to follow-up in 6 months with expectant management and colposcopy at that time. Orders  No orders of the defined types were placed in this encounter.   No orders of the defined types were placed in this encounter.     F/U  Return in about 6 months (around 01/05/2019). I spent 17 minutes involved in the care of this patient of which greater than 50% was spent discussing CIN-2 management options risks and benefits of these  options.  All questions answered.  Elonda Husky, M.D. 07/07/2018 9:57 AM

## 2018-07-27 ENCOUNTER — Encounter: Payer: Self-pay | Admitting: *Deleted

## 2018-07-27 NOTE — Progress Notes (Signed)
Per Dr. Logan Bores notes, patient has decided to return in 6 months for another colposcopy and possible biopsy instead of undergoing a LEEP procedure.  HSIS to Chrisy.

## 2019-01-06 ENCOUNTER — Encounter: Payer: Self-pay | Admitting: Obstetrics and Gynecology

## 2019-01-25 DIAGNOSIS — E663 Overweight: Secondary | ICD-10-CM

## 2019-01-25 DIAGNOSIS — Z62810 Personal history of physical and sexual abuse in childhood: Secondary | ICD-10-CM

## 2019-01-26 ENCOUNTER — Ambulatory Visit: Payer: Self-pay

## 2019-01-27 ENCOUNTER — Ambulatory Visit: Payer: Self-pay

## 2019-01-28 ENCOUNTER — Other Ambulatory Visit: Payer: Self-pay

## 2019-01-28 ENCOUNTER — Ambulatory Visit (LOCAL_COMMUNITY_HEALTH_CENTER): Payer: Self-pay | Admitting: Advanced Practice Midwife

## 2019-01-28 ENCOUNTER — Encounter: Payer: Self-pay | Admitting: Advanced Practice Midwife

## 2019-01-28 VITALS — BP 111/72 | Ht 62.0 in | Wt 136.4 lb

## 2019-01-28 DIAGNOSIS — F17200 Nicotine dependence, unspecified, uncomplicated: Secondary | ICD-10-CM | POA: Insufficient documentation

## 2019-01-28 DIAGNOSIS — Z30013 Encounter for initial prescription of injectable contraceptive: Secondary | ICD-10-CM

## 2019-01-28 DIAGNOSIS — Z3009 Encounter for other general counseling and advice on contraception: Secondary | ICD-10-CM

## 2019-01-28 DIAGNOSIS — Z3042 Encounter for surveillance of injectable contraceptive: Secondary | ICD-10-CM

## 2019-01-28 MED ORDER — MEDROXYPROGESTERONE ACETATE 150 MG/ML IM SUSP
150.0000 mg | Freq: Once | INTRAMUSCULAR | Status: AC
  Administered 2019-01-28: 150 mg via INTRAMUSCULAR

## 2019-01-28 NOTE — Addendum Note (Signed)
Addended by: Doy Mince on: 01/28/2019 05:22 PM   Modules accepted: Orders

## 2019-01-28 NOTE — Progress Notes (Signed)
In desiring Depo; last 05/19/18; pregnancy test per E. Sciora, CNM Debera Lat, RN
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
## 2019-01-29 LAB — PREGNANCY, URINE: Preg Test, Ur: NEGATIVE

## 2019-05-04 ENCOUNTER — Ambulatory Visit: Payer: Self-pay

## 2019-05-12 ENCOUNTER — Ambulatory Visit: Payer: Self-pay

## 2019-05-28 ENCOUNTER — Ambulatory Visit: Payer: Self-pay

## 2019-05-28 ENCOUNTER — Encounter: Payer: Self-pay | Admitting: Family Medicine

## 2019-05-28 ENCOUNTER — Other Ambulatory Visit: Payer: Self-pay

## 2019-05-28 ENCOUNTER — Ambulatory Visit (LOCAL_COMMUNITY_HEALTH_CENTER): Payer: Self-pay | Admitting: Family Medicine

## 2019-05-28 VITALS — BP 107/70 | Ht 62.0 in | Wt 146.0 lb

## 2019-05-28 DIAGNOSIS — N760 Acute vaginitis: Secondary | ICD-10-CM

## 2019-05-28 DIAGNOSIS — B9689 Other specified bacterial agents as the cause of diseases classified elsewhere: Secondary | ICD-10-CM

## 2019-05-28 DIAGNOSIS — R87619 Unspecified abnormal cytological findings in specimens from cervix uteri: Secondary | ICD-10-CM | POA: Insufficient documentation

## 2019-05-28 DIAGNOSIS — Z3169 Encounter for other general counseling and advice on procreation: Secondary | ICD-10-CM

## 2019-05-28 DIAGNOSIS — Z30013 Encounter for initial prescription of injectable contraceptive: Secondary | ICD-10-CM

## 2019-05-28 DIAGNOSIS — Z113 Encounter for screening for infections with a predominantly sexual mode of transmission: Secondary | ICD-10-CM

## 2019-05-28 DIAGNOSIS — Z01419 Encounter for gynecological examination (general) (routine) without abnormal findings: Secondary | ICD-10-CM

## 2019-05-28 LAB — WET PREP FOR TRICH, YEAST, CLUE
Trichomonas Exam: NEGATIVE
Yeast Exam: NEGATIVE

## 2019-05-28 LAB — PREGNANCY, URINE: Preg Test, Ur: NEGATIVE

## 2019-05-28 MED ORDER — THERA VITAL M PO TABS: 1.0000 | ORAL_TABLET | Freq: Every day | ORAL | 0 refills | Status: DC

## 2019-05-28 MED ORDER — METRONIDAZOLE 500 MG PO TABS: 500.0000 mg | ORAL_TABLET | Freq: Two times a day (BID) | ORAL | 0 refills | Status: AC

## 2019-05-28 MED ORDER — MEDROXYPROGESTERONE ACETATE 150 MG/ML IM SUSP
150.0000 mg | INTRAMUSCULAR | Status: AC
  Administered 2019-05-28 – 2019-10-28 (×2): 150 mg via INTRAMUSCULAR
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
## 2019-05-28 NOTE — Progress Notes (Signed)
In for physical and Depo; desires HIV/RPR testing Sharlette Dense, RN Wet prep reviewed-+BV treated per standing order; phone # given for [facility] to schedule colpo f/u Sharlette Dense, RN

## 2019-06-04 LAB — HEPATITIS B SURFACE ANTIGEN

## 2019-06-08 LAB — HM HIV SCREENING LAB: HM HIV Screening: NEGATIVE

## 2019-06-08 LAB — HM HEPATITIS C SCREENING LAB: HM Hepatitis Screen: NEGATIVE

## 2019-08-12 ENCOUNTER — Encounter: Payer: Self-pay | Admitting: Certified Nurse Midwife

## 2019-09-08 ENCOUNTER — Ambulatory Visit: Payer: Self-pay | Attending: Oncology

## 2019-09-08 ENCOUNTER — Other Ambulatory Visit: Payer: Self-pay

## 2019-09-08 DIAGNOSIS — N871 Moderate cervical dysplasia: Secondary | ICD-10-CM

## 2019-09-09 ENCOUNTER — Telehealth: Payer: Self-pay | Admitting: *Deleted

## 2019-09-12 DIAGNOSIS — N871 Moderate cervical dysplasia: Secondary | ICD-10-CM | POA: Insufficient documentation

## 2019-09-12 NOTE — Progress Notes (Signed)
Patient pre-screened for BCCCP eligibility by Lisa [name]. Lisa [name] interpreted.  Two patient identifiers used for verification to ensure correct patient.  She was scheduled for 6 month follow-up colposcopy rather than LEEP at patient request, but appointment was cancelled, Patient to present directly to [facility] for follow-up on 09/13/19.

## 2019-09-13 ENCOUNTER — Other Ambulatory Visit: Payer: Self-pay

## 2019-09-13 ENCOUNTER — Encounter: Payer: Self-pay | Admitting: Certified Nurse Midwife

## 2019-09-14 ENCOUNTER — Other Ambulatory Visit: Payer: Self-pay | Admitting: Obstetrics and Gynecology

## 2019-09-14 ENCOUNTER — Ambulatory Visit (INDEPENDENT_AMBULATORY_CARE_PROVIDER_SITE_OTHER): Payer: Self-pay | Admitting: Obstetrics and Gynecology

## 2019-09-14 ENCOUNTER — Encounter: Payer: Self-pay | Admitting: Obstetrics and Gynecology

## 2019-09-14 VITALS — BP 104/71 | HR 82 | Ht 62.0 in | Wt 156.3 lb

## 2019-09-14 DIAGNOSIS — N871 Moderate cervical dysplasia: Secondary | ICD-10-CM

## 2019-09-14 NOTE — Progress Notes (Signed)
Referring Provider:  BCCCP  HPI:  Lisa [name] is a 29 y.o.  [phone]  who presents today for evaluation and management of abnormal cervical cytology.    Dysplasia History:  CIN II at colposcopy 6 months ag0  ROS:  Pertinent items noted in HPI and remainder of comprehensive ROS otherwise negative.  OB History  Gravida Para Term Preterm AB Living  3 3 3  0 0 3  SAB TAB Ectopic Multiple Live Births               # Outcome Date GA Lbr Len/2nd Weight Sex Delivery Anes PTL Lv  3 Term           2 Term           1 Term             Past Medical History:  Diagnosis Date  . Migraines     Past Surgical History:  Procedure Laterality Date  . APPENDECTOMY    . CESAREAN SECTION    . CHOLECYSTECTOMY      SOCIAL HISTORY: Social History   Substance and Sexual Activity  Alcohol Use No   Social History   Substance and Sexual Activity  Drug Use No     Family History  Problem Relation Age of Onset  . Hypertension Maternal Grandmother   . Diabetes Maternal Grandmother   . Prostate cancer Maternal Grandfather   . Down syndrome Half-Brother   . Anemia Mother     ALLERGIES:  Amoxicillin, Diclofenac, Dicyclomine, and Latex  She has a current medication list which includes the following prescription(s): multivitamin, and the following Facility-Administered Medications: medroxyprogesterone.  Physical Exam: -Vitals:  BP 104/71   Pulse 82   Ht 5\' 2"  (1.575 m)   Wt 156 lb 4.8 oz (70.9 kg)   BMI 28.59 kg/m   PROCEDURE: Colposcopy performed with 4% acetic acid after verbal consent obtained                           -Aceto-white Lesions Location(s): 4-7 o'clock.              -Biopsy performed at 4 and 7 o'clock               -ECC indicated and performed: No.     -Biopsy sites made hemostatic with pressure and Monsel's solution   -Satisfactory colposcopy: Yes.      -Evidence of Invasive cervical CA :  NO  ASSESSMENT:  Lisa [name] is a 29 y.o. [name]  here for  1. CIN II (cervical intraepithelial neoplasia II)   .  PLAN: 1.  I discussed the grading system of pap smears and HPV high risk viral types.  We will discuss management after colpo results return.  No orders of the defined types were placed in this encounter.          F/U  Return in about 2 weeks (around 09/28/2019) for Colpo f/u.  [id number] ,MD 09/14/2019,7:59 AM

## 2019-09-17 LAB — ANATOMIC PATHOLOGY REPORT

## 2019-09-24 ENCOUNTER — Ambulatory Visit: Payer: Self-pay

## 2019-09-27 ENCOUNTER — Ambulatory Visit: Payer: Self-pay

## 2019-09-28 ENCOUNTER — Ambulatory Visit (INDEPENDENT_AMBULATORY_CARE_PROVIDER_SITE_OTHER): Payer: Self-pay | Admitting: Obstetrics and Gynecology

## 2019-09-28 ENCOUNTER — Encounter: Payer: Self-pay | Admitting: Obstetrics and Gynecology

## 2019-09-28 ENCOUNTER — Other Ambulatory Visit: Payer: Self-pay

## 2019-09-28 VITALS — BP 118/80 | HR 75 | Ht 62.0 in | Wt 156.8 lb

## 2019-09-28 DIAGNOSIS — N871 Moderate cervical dysplasia: Secondary | ICD-10-CM
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
## 2019-10-08 ENCOUNTER — Ambulatory Visit: Payer: Self-pay

## 2019-10-26 ENCOUNTER — Ambulatory Visit: Payer: Self-pay

## 2019-10-28 ENCOUNTER — Other Ambulatory Visit: Payer: Self-pay

## 2019-10-28 ENCOUNTER — Ambulatory Visit (LOCAL_COMMUNITY_HEALTH_CENTER): Payer: Self-pay | Admitting: Physician Assistant

## 2019-10-28 ENCOUNTER — Encounter: Payer: Self-pay | Admitting: Physician Assistant

## 2019-10-28 VITALS — BP 106/73 | Ht 63.0 in | Wt 156.0 lb

## 2019-10-28 DIAGNOSIS — Z30013 Encounter for initial prescription of injectable contraceptive: Secondary | ICD-10-CM

## 2019-10-28 DIAGNOSIS — Z3009 Encounter for other general counseling and advice on contraception: Secondary | ICD-10-CM

## 2019-10-28 DIAGNOSIS — Z113 Encounter for screening for infections with a predominantly sexual mode of transmission: Secondary | ICD-10-CM

## 2019-10-28 DIAGNOSIS — Z3042 Encounter for surveillance of injectable contraceptive: Secondary | ICD-10-CM

## 2019-10-28 LAB — WET PREP FOR TRICH, YEAST, CLUE
Trichomonas Exam: NEGATIVE
Yeast Exam: NEGATIVE

## 2019-10-28 MED ORDER — THERA VITAL M PO TABS: 1.0000 | ORAL_TABLET | Freq: Every day | ORAL | 0 refills | Status: DC

## 2019-10-28 NOTE — Progress Notes (Signed)
Pt received Depo today per provider order and pt tolerated well. Wet mount reviewed and is negative today, so no treatment needed for wet mount per standing order. Counseled pt per provider orders and pt states understanding. Pt received MVI's per pt request. Provider orders completed.

## 2019-10-28 NOTE — Progress Notes (Signed)
Pt here to get on Depo. Pt reports she uses condoms currently every time she is sexually active. Last Depo was 05/28/2019, so pt is 21 weeks and 6 days post last Depo. Pt desires blood work today for HIV and syphillis, as well as STD screening.

## 2019-10-28 NOTE — Progress Notes (Signed)
° °  [facility] problem visit  [facility]  Subjective:  Lisa [name] is a 29 y.o. being seen today to restart Depo and requests STD screening.  Chief Complaint  Patient presents with   Contraception    restart Depo    HPI  Patient is 21 wk 6 days since last Depo and has been using condoms as BCM.  Would like to restart Depo today.  States that she had vaginal itching 2 weeks ago but that it has resolved and she would like a screening to make sure everything is ok.  Declines pregnancy test today.    Does the patient have a current or past history of drug use? No   No components found for: HCV]   Health Maintenance Due  Topic Date Due   COVID-19 Vaccine (1) Never done   TETANUS/TDAP  Never done    Review of Systems  All other systems reviewed and are negative.   The following portions of the patient's history were reviewed and updated as appropriate: allergies, current medications, past family history, past medical history, past social history, past surgical history and problem list. Problem list updated.   See flowsheet for other program required questions.  Objective:   Vitals:   10/28/19 1044  BP: 106/73  Weight: 156 lb (70.8 kg)  Height: 5\' 3"  (1.6 m)    Physical Exam Vitals and nursing note reviewed.  Constitutional:      General: She is not in acute distress.    Appearance: Normal appearance.  HENT:     Head: Normocephalic and atraumatic.  Eyes:     Conjunctiva/sclera: Conjunctivae normal.  Pulmonary:     Effort: Pulmonary effort is normal.  Neurological:     Mental Status: She is alert and oriented to person, place, and time.  Psychiatric:        Mood and Affect: Mood normal.        Behavior: Behavior normal.        Thought Content: Thought content normal.        Judgment: Judgment normal.     Patient opts to self-collect samples from vagina for GC/Chlamydia and wet mount today.   Assessment and  Plan:  Lisa [name] is a 29 y.o. Lisa presenting to the [facility] Department for a Women's Health problem visit  1. Encounter for counseling regarding contraception Reviewed with patient SE of Depo and when to call clinic for irregular bleeding. Rec condoms with all sex for 2 weeks after shot today and to do an OTC pregnancy test in 2 weeks.  RTC if pregnancy test is positive. Enc condoms with all sex for STD protection.  Enc patient to follow up per recs for previous abnl pap.  Per note, needs pap annually for 3 yrs.  2. Screening for STD (sexually transmitted disease) Await test results.  Counseled that RN will call if needs to RTC for treatment once results are back.  - WET PREP FOR TRICH, YEAST, CLUE - Chlamydia/Gonorrhea [place] [facility] - HIV [place] [facility] - Syphilis Serology, [place] [facility]  3. Surveillance for Depo-Provera contraception OK to give Depo 150mg  IM today per order from RP on 05/28/2019.     No follow-ups on file.  No future appointments.  , PA

## 2019-11-26 ENCOUNTER — Other Ambulatory Visit: Payer: Self-pay

## 2019-11-26 ENCOUNTER — Emergency Department
Admission: EM | Admit: 2019-11-26 | Discharge: 2019-11-26 | Disposition: A | Discharge status: Home / Self Care | Payer: Self-pay | Attending: Emergency Medicine | Admitting: Emergency Medicine

## 2019-11-26 DIAGNOSIS — L0291 Cutaneous abscess, unspecified: Secondary | ICD-10-CM

## 2019-11-26 DIAGNOSIS — L02411 Cutaneous abscess of right axilla: Secondary | ICD-10-CM | POA: Insufficient documentation

## 2019-11-26 DIAGNOSIS — L02412 Cutaneous abscess of left axilla: Secondary | ICD-10-CM | POA: Insufficient documentation

## 2019-11-26 DIAGNOSIS — R21 Rash and other nonspecific skin eruption: Secondary | ICD-10-CM

## 2019-11-26 DIAGNOSIS — Z7982 Long term (current) use of aspirin: Secondary | ICD-10-CM | POA: Insufficient documentation

## 2019-11-26 DIAGNOSIS — Z91040 Latex allergy status: Secondary | ICD-10-CM | POA: Insufficient documentation

## 2019-11-26 DIAGNOSIS — Z87891 Personal history of nicotine dependence: Secondary | ICD-10-CM | POA: Insufficient documentation

## 2019-11-26 MED ORDER — CLINDAMYCIN HCL 300 MG PO CAPS: 300.0000 mg | ORAL_CAPSULE | Freq: Three times a day (TID) | ORAL | 0 refills | Status: AC

## 2019-11-26 NOTE — ED Triage Notes (Signed)
Pt comes via POV from home with c/o abscess under bilateral armpits. Pt states she noticed it 2 weeks ago. Pt denies any drainage.

## 2019-11-26 NOTE — Discharge Instructions (Signed)
Please quit using your deodorant as it may be giving you a rash.  I am giving you antibiotics to cover for an infection.  Please call primary care on Monday for a follow-up next week for recheck.

## 2019-11-26 NOTE — ED Notes (Signed)
See triage note  Presents with possible abscess area under both arms  Stats she noticed areas about 2 weeks ago  No fever  She has noticed some drainage
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
## 2020-01-05 NOTE — Progress Notes (Unsigned)
Per Dr. Logan Bores Patient to have annual pap x 3 years.  After having benign cervical biopsy results. Notified Lisa Haber NP at [facility].

## 2020-01-07 ENCOUNTER — Encounter: Payer: Self-pay | Admitting: Physician Assistant

## 2020-01-07 NOTE — Progress Notes (Signed)
Reviewed note from Coralee Rud, RN from [facility].  Per note, recommendation from Dr. Logan Bores is for patient to have pap annually for 3 years.  Also, if needed we can refer back to [facility].

## 2020-04-27 ENCOUNTER — Encounter: Payer: Self-pay | Admitting: Intensive Care

## 2020-04-27 ENCOUNTER — Emergency Department: Payer: Self-pay

## 2020-04-27 ENCOUNTER — Emergency Department
Admission: EM | Admit: 2020-04-27 | Discharge: 2020-04-27 | Disposition: A | Discharge status: Home / Self Care | Payer: Self-pay | Attending: Emergency Medicine | Admitting: Emergency Medicine

## 2020-04-27 ENCOUNTER — Other Ambulatory Visit: Payer: Self-pay

## 2020-04-27 DIAGNOSIS — M25562 Pain in left knee: Secondary | ICD-10-CM | POA: Insufficient documentation

## 2020-04-27 DIAGNOSIS — Z7982 Long term (current) use of aspirin: Secondary | ICD-10-CM | POA: Insufficient documentation

## 2020-04-27 DIAGNOSIS — M25561 Pain in right knee: Secondary | ICD-10-CM | POA: Insufficient documentation

## 2020-04-27 DIAGNOSIS — Z87891 Personal history of nicotine dependence: Secondary | ICD-10-CM | POA: Insufficient documentation

## 2020-04-27 DIAGNOSIS — Z91040 Latex allergy status: Secondary | ICD-10-CM | POA: Insufficient documentation

## 2020-04-27 MED ORDER — MELOXICAM 15 MG PO TABS
15.0000 mg | ORAL_TABLET | Freq: Every day | ORAL | 2 refills | Status: DC
Start: 2020-04-27 — End: 2020-07-19

## 2020-04-27 NOTE — Discharge Instructions (Addendum)
Take meloxicam once daily for pain and inflammation. Please make follow-up appointment with orthopedics, Dr. Rosita Kea.
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
## 2020-04-27 NOTE — ED Triage Notes (Signed)
PAtient presents with left knee pain that travels down to her foot. Difficult to ambulate.

## 2020-05-31 ENCOUNTER — Ambulatory Visit: Payer: Self-pay

## 2020-07-19 ENCOUNTER — Ambulatory Visit (LOCAL_COMMUNITY_HEALTH_CENTER): Payer: 59 | Admitting: Family Medicine

## 2020-07-19 ENCOUNTER — Other Ambulatory Visit: Payer: Self-pay

## 2020-07-19 VITALS — BP 101/65 | Wt 161.0 lb

## 2020-07-19 DIAGNOSIS — Z0000 Encounter for general adult medical examination without abnormal findings: Secondary | ICD-10-CM | POA: Diagnosis not present

## 2020-07-19 DIAGNOSIS — Z30013 Encounter for initial prescription of injectable contraceptive: Secondary | ICD-10-CM | POA: Diagnosis not present

## 2020-07-19 DIAGNOSIS — Z3202 Encounter for pregnancy test, result negative: Secondary | ICD-10-CM

## 2020-07-19 DIAGNOSIS — Z3009 Encounter for other general counseling and advice on contraception: Secondary | ICD-10-CM | POA: Diagnosis not present

## 2020-07-19 LAB — PREGNANCY, URINE: Preg Test, Ur: NEGATIVE

## 2020-07-19 MED ORDER — MEDROXYPROGESTERONE ACETATE 150 MG/ML IM SUSP
150.0000 mg | INTRAMUSCULAR | Status: AC
  Administered 2020-07-19: 150 mg via INTRAMUSCULAR
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
## 2020-07-19 NOTE — Progress Notes (Signed)
Here for PE and Depo. Denies taking Thyroid med but reports weight gain. Strongly encouraged patient to call [facility] for appt to manage Thyroid. Verbalized understanding.Richmond Campbell, RN   Depo given tolerated well Richmond Campbell, RN

## 2021-03-12 ENCOUNTER — Ambulatory Visit (LOCAL_COMMUNITY_HEALTH_CENTER): Payer: 59 | Admitting: Physician Assistant

## 2021-03-12 ENCOUNTER — Other Ambulatory Visit: Payer: Self-pay

## 2021-03-12 VITALS — BP 104/71 | Ht 62.0 in | Wt 161.0 lb

## 2021-03-12 DIAGNOSIS — Z30011 Encounter for initial prescription of contraceptive pills: Secondary | ICD-10-CM | POA: Diagnosis not present

## 2021-03-12 DIAGNOSIS — Z113 Encounter for screening for infections with a predominantly sexual mode of transmission: Secondary | ICD-10-CM

## 2021-03-12 DIAGNOSIS — Z3009 Encounter for other general counseling and advice on contraception: Secondary | ICD-10-CM

## 2021-03-12 LAB — WET PREP FOR TRICH, YEAST, CLUE
Trichomonas Exam: NEGATIVE
Yeast Exam: NEGATIVE

## 2021-03-12 MED ORDER — NORGESTIMATE-ETH ESTRADIOL 0.25-35 MG-MCG PO TABS: 1.0000 | ORAL_TABLET | Freq: Every day | ORAL | 5 refills | Status: DC

## 2021-03-12 NOTE — Progress Notes (Signed)
Patient here for [facility]. Wet mount reviewed per standing orders. Non-latex condoms given to patient. BC oral pills given to patient. No further questions or concerns during visit.

## 2021-03-13 NOTE — Progress Notes (Signed)
[facility] problem visit  [facility]  Subjective:  Lisa [name] is a 30 y.o. being seen today for [facility] and STD screening.  Chief Complaint  Patient presents with   Contraception    HPI Patient states that she would like to get pregnant within the next year but also states that she would like to restart Depo today.  Patient reports that she was previously on OCP but would forget to take them.  States she got a Depo at her annual visit in March and that she had her first period since that Depo on 03/02/2021.  Reports that with certain positions during sex it is painful and she will sometimes have some bleeding when she wipes after sex.  Declines blood work today.   Does the patient have a current or past history of drug use? No   No components found for: HCV]   Health Maintenance Due  Topic Date Due   COVID-19 Vaccine (1) Never done   Pneumococcal Vaccine 10-57 Years old (1 - PCV) Never done   TETANUS/TDAP  Never done   INFLUENZA VACCINE  Never done    Review of Systems  All other systems reviewed and are negative.  The following portions of the patient's history were reviewed and updated as appropriate: allergies, current medications, past family history, past medical history, past social history, past surgical history and problem list. Problem list updated.   See flowsheet for other program required questions.  Objective:   Vitals:   03/12/21 1352  BP: 104/71  Weight: 161 lb (73 kg)  Height: 5\' 2"  (1.575 m)    Physical Exam Vitals and nursing note reviewed.  Constitutional:      General: She is not in acute distress.    Appearance: Normal appearance.  HENT:     Head: Normocephalic and atraumatic.  Eyes:     Conjunctiva/sclera: Conjunctivae normal.  Pulmonary:     Effort: Pulmonary effort is normal.  Neurological:     Mental Status: She is alert and oriented to person, place, and time.  Psychiatric:         Mood and Affect: Mood normal.        Behavior: Behavior normal.        Thought Content: Thought content normal.        Judgment: Judgment normal.      Assessment and Plan:  Lisa [name] is a 30 y.o. female presenting to the [facility] Department for a Women's Health problem visit  1. Encounter for counseling regarding contraception Reviewed with patient re: BCM options. Counseled patient that after Depo it can take up to 18 months for periods to return to normal/regular pattern and that without regular periods, it can make it more difficult to track cycles and get pregnant. Counseled patient that if she were to get Depo today, it may take her up to 2 yr after the shot to achieve pregnancty. Counseled that if she would like to get pregnant within the next year, it may be better to use a method that will wear off soon after stopping it to achieve pregnancy. Patient states that she previously used OCP and  would be willing to use them for a few months until she is ready to try for pregnancy. Reviewed with patient normal SE of OCP and when to call clinic with concerns. Enc condoms with all sex for STD protection.   2. Screening for STD (sexually transmitted disease)  Patient declines  blood work today. Patient opts to self collect vaginal samples for testing today.  Counseled how to collect for accurate results. Reviewed wet mount results and no treatment is indicated today. - WET PREP FOR TRICH, YEAST, CLUE - Chlamydia/Gonorrhea [facility]  3. OCP (oral contraceptive pills) initiation Give Sprintec 28 d 1 po daily at the same time each day to start today.  Give 5 packs to last until RP due in 07/2021. - norgestimate-ethinyl estradiol (SPRINTEC 28) 0.25-35 MG-MCG tablet; Take 1 tablet by mouth daily.  Dispense: 28 tablet; Refill: 5     Return in about 5 months (around 08/10/2021) for RP and prn.  No future appointments.  Matt Holmes, PA

## 2021-04-10 ENCOUNTER — Encounter: Payer: Self-pay | Admitting: Advanced Practice Midwife

## 2021-04-10 ENCOUNTER — Other Ambulatory Visit: Payer: Self-pay

## 2021-04-10 ENCOUNTER — Ambulatory Visit (LOCAL_COMMUNITY_HEALTH_CENTER): Payer: 59 | Admitting: Advanced Practice Midwife

## 2021-04-10 VITALS — BP 102/65 | HR 81 | Ht 62.0 in | Wt 155.8 lb

## 2021-04-10 DIAGNOSIS — Z3009 Encounter for other general counseling and advice on contraception: Secondary | ICD-10-CM

## 2021-04-10 DIAGNOSIS — R87619 Unspecified abnormal cytological findings in specimens from cervix uteri: Secondary | ICD-10-CM

## 2021-04-10 DIAGNOSIS — Z30013 Encounter for initial prescription of injectable contraceptive: Secondary | ICD-10-CM | POA: Diagnosis not present

## 2021-04-10 MED ORDER — MEDROXYPROGESTERONE ACETATE 150 MG/ML IM SUSP
150.0000 mg | Freq: Once | INTRAMUSCULAR | Status: AC
  Administered 2021-04-10: 150 mg via INTRAMUSCULAR

## 2021-04-10 MED ORDER — MEDROXYPROGESTERONE ACETATE 150 MG/ML IM SUSP
150.0000 mg | Freq: Once | INTRAMUSCULAR | Status: AC
  Administered 2022-03-29: 150 mg via INTRAMUSCULAR

## 2021-04-10 NOTE — Progress Notes (Signed)
Contraception/Family Planning VISIT ENCOUNTER NOTE  Subjective:   Lisa [name] is a 30 y.o.SHF nonsmoker G3P3003 (53,97,6) female here for reproductive life counseling.  Desires DMPA for BCM.  Reports she does not want a pregnancy in the next year. Denies abnormal vaginal bleeding, discharge, pelvic pain, problems with intercourse or other gynecologic concerns. Last DMPA 07/19/20. Was given Sprintec 03/12/21 and can't remember to take it daily at same time. Wants to conceive 2023.  Wants DMPA instead today. Last sex 04/01/21 with condom.  LMP 03/28/21.Last pap LSIL 05/19/18. Colpo 06/10/18 with CIN 2 no dysplasia per Dr. Logan Bores with recommendation for yearly paps. Pt can't remember when her last pap was but will call and find out when she had her last one and when needs next one.    Gynecologic History Patient's last menstrual period was 03/28/2021. Contraception: Depo-Provera injections  Health Maintenance Due  Topic Date Due   COVID-19 Vaccine (1) Never done   Pneumococcal Vaccine 20-33 Years old (1 - PCV) Never done   TETANUS/TDAP  Never done   INFLUENZA VACCINE  Never done   PAP SMEAR-Modifier  05/19/2021     The following portions of the patient's history were reviewed and updated as appropriate: allergies, current medications, past family history, past medical history, past social history, past surgical history and problem list.  Review of Systems Pertinent items are noted in HPI.   Objective:  BP 102/65   Pulse 81   Ht 5\' 2"  (1.575 m)   Wt 155 lb 12.8 oz (70.7 kg)   LMP 03/28/2021 Comment: 5 day cycle  BMI 28.50 kg/m  Gen: well appearing, NAD HEENT: no scleral icterus CV: RR Lung: Normal WOB Ext: warm well perfused   Assessment and Plan:   Contraception counseling: Reviewed all forms of birth control options in the tiered based approach. available including abstinence; over the counter/barrier methods; hormonal contraceptive medication including pill, patch,  ring, injection,contraceptive implant, ECP; hormonal and nonhormonal IUDs; permanent sterilization options including vasectomy and the various tubal sterilization modalities. Risks, benefits, and typical effectiveness rates were reviewed.  Questions were answered.  Written information was also given to the patient to review.  Patient desires DMPA, this was prescribed for patient. She will follow up in  11-13 wks for surveillance.  She was told to call with any further questions, or with any concerns about this method of contraception.  Emphasized use of condoms 100% of the time for STI prevention.  Patient was offered ECP. ECP was not accepted by the patient. ECP counseling was not given - see RN documentation  1. Abnormal cervical Papanicolaou smear, unspecified abnormal pap finding Pt can't remember when her last pap was so she will call Dr. 03/30/2021 and find out when last and when should get next yearly pap  2. Family planning Pt states she has been noncompliant with ocp's but using condoms every time she has sex. Wants DMPA 150 mg IM x1 Please counsel on need for abstinance next 7 days Will need PT before next DMPA given - medroxyPROGESTERone (DEPO-PROVERA) injection 150 mg  3. Encounter for initial prescription of injectable contraceptive See above note    Please refer to After Visit Summary for other counseling recommendations.   No follow-ups on file.  Logan Bores, CNM [facility] DEPARTMENT

## 2021-04-10 NOTE — Progress Notes (Signed)
The Language Line used today.  Lars Mage / [phone]. Hart Carwin, RN  DMPA 150 mg given IM (Left Deltoid) per Verbal Order today and tolerated well.  Patient is to return in 11 weeks for a PT prior to her Depo around 06/26/2021.  Card given to patient.  Per Arnetha Courser, CNM, patient to call Dr. Michel Harrow office to request a copy of her last PAP Smear. Hart Carwin, RN

## 2021-05-29 IMAGING — DX DG KNEE COMPLETE 4+V*L*
4 series · 4 of 4 positions shown · non-contrast
Comparison: None.

CLINICAL DATA: 29-year-old female with left knee pain.

EXAM:
LEFT KNEE - COMPLETE 4+ VIEW

[knee ap]
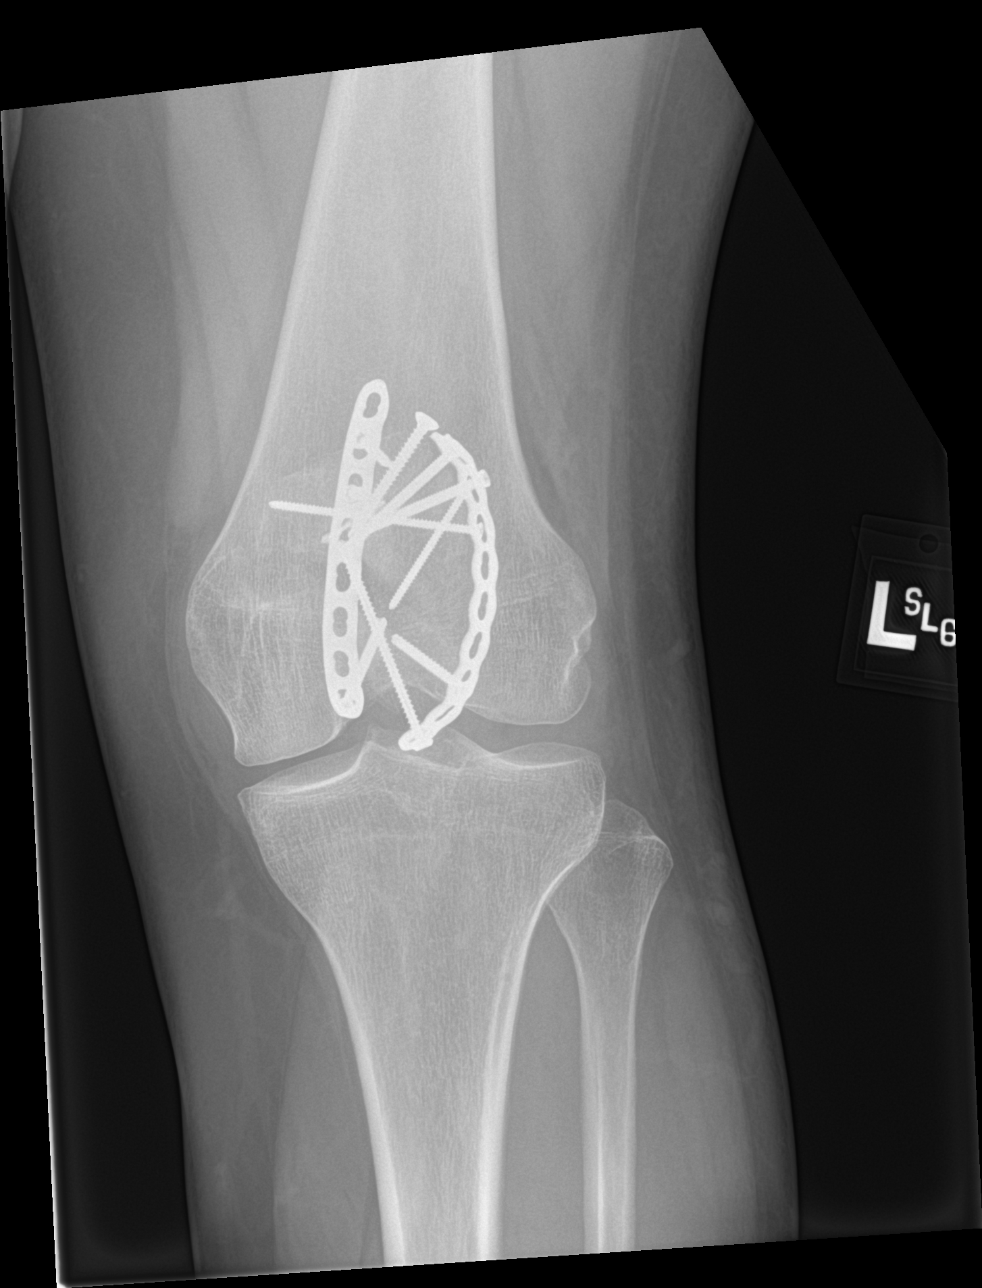

[knee obl (1 of 2)]
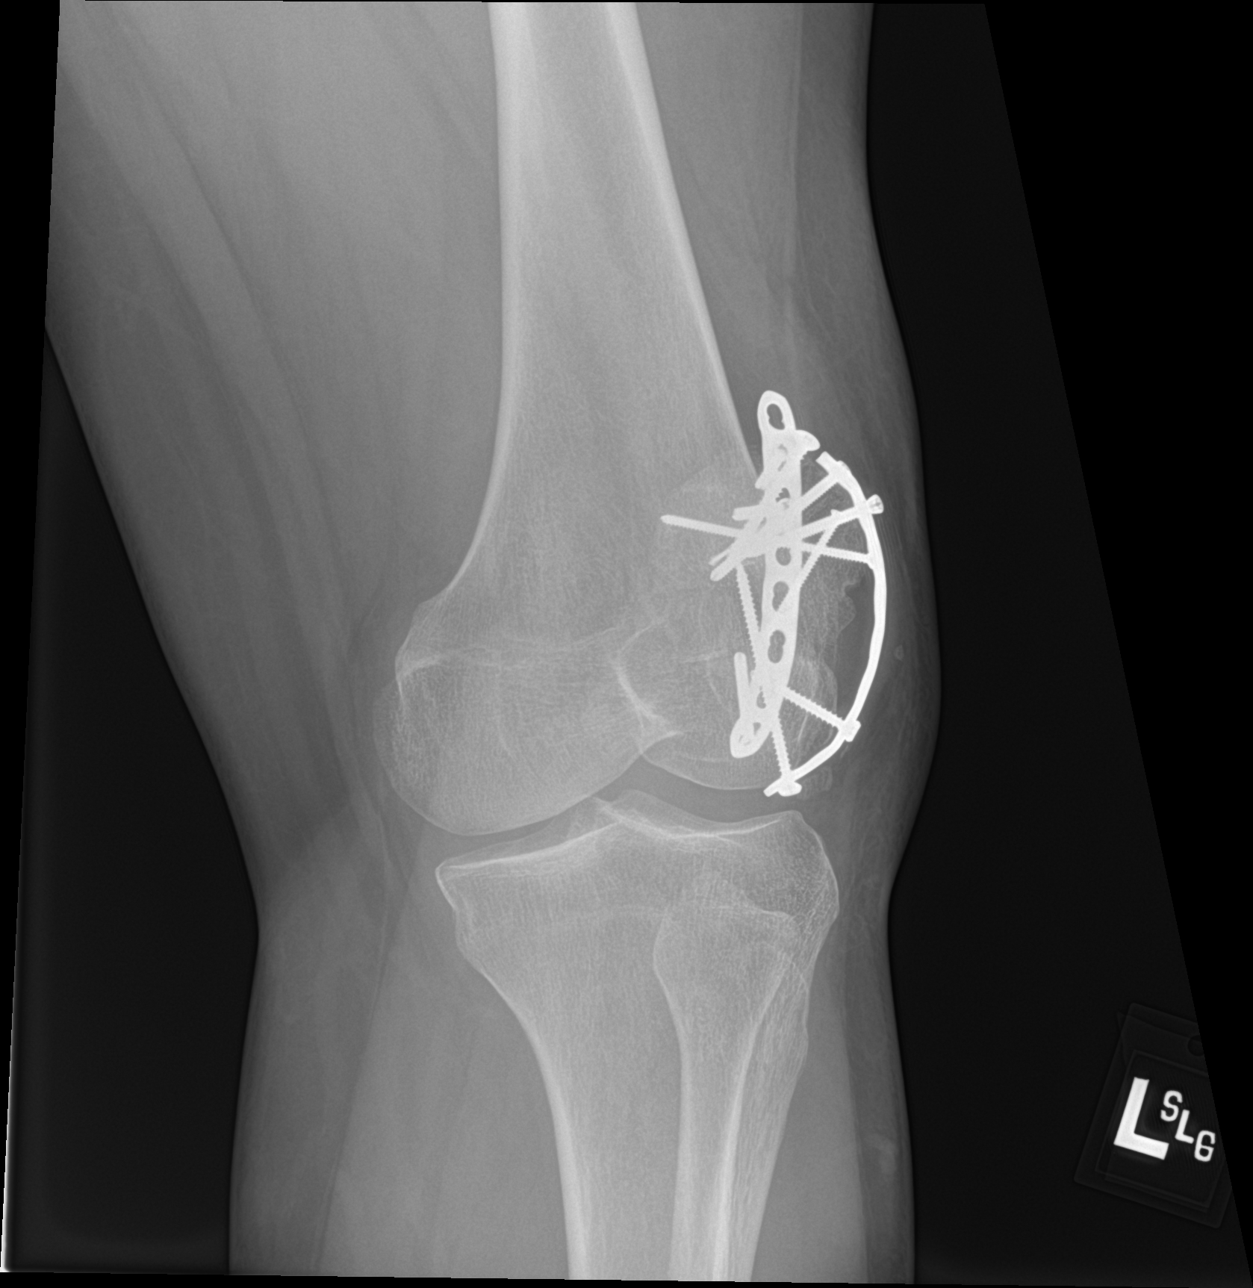

[knee obl (2 of 2)]
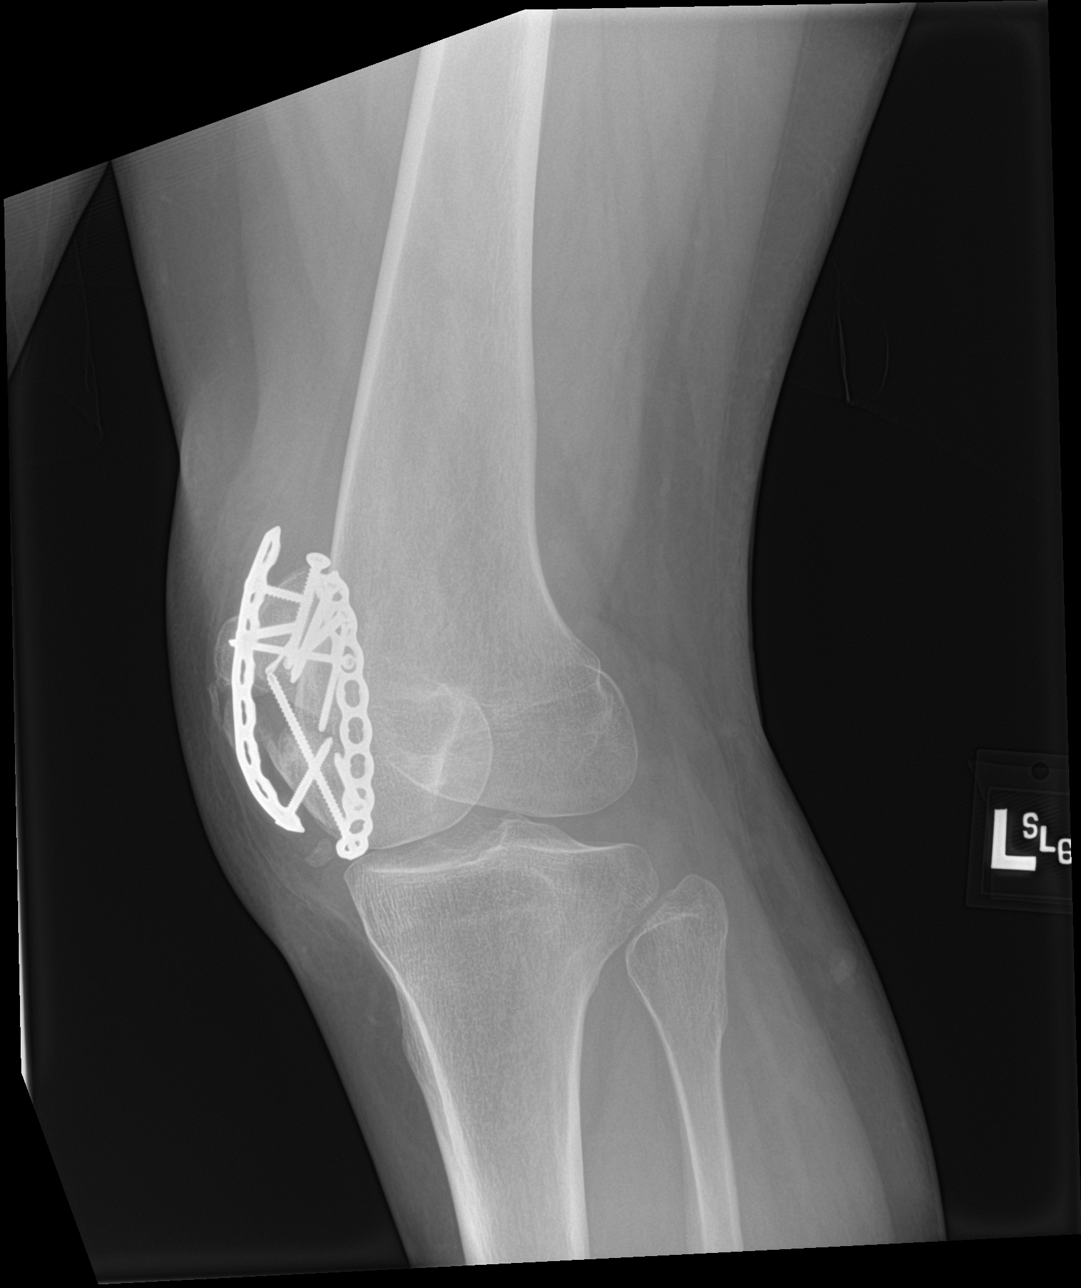

[knee lat]
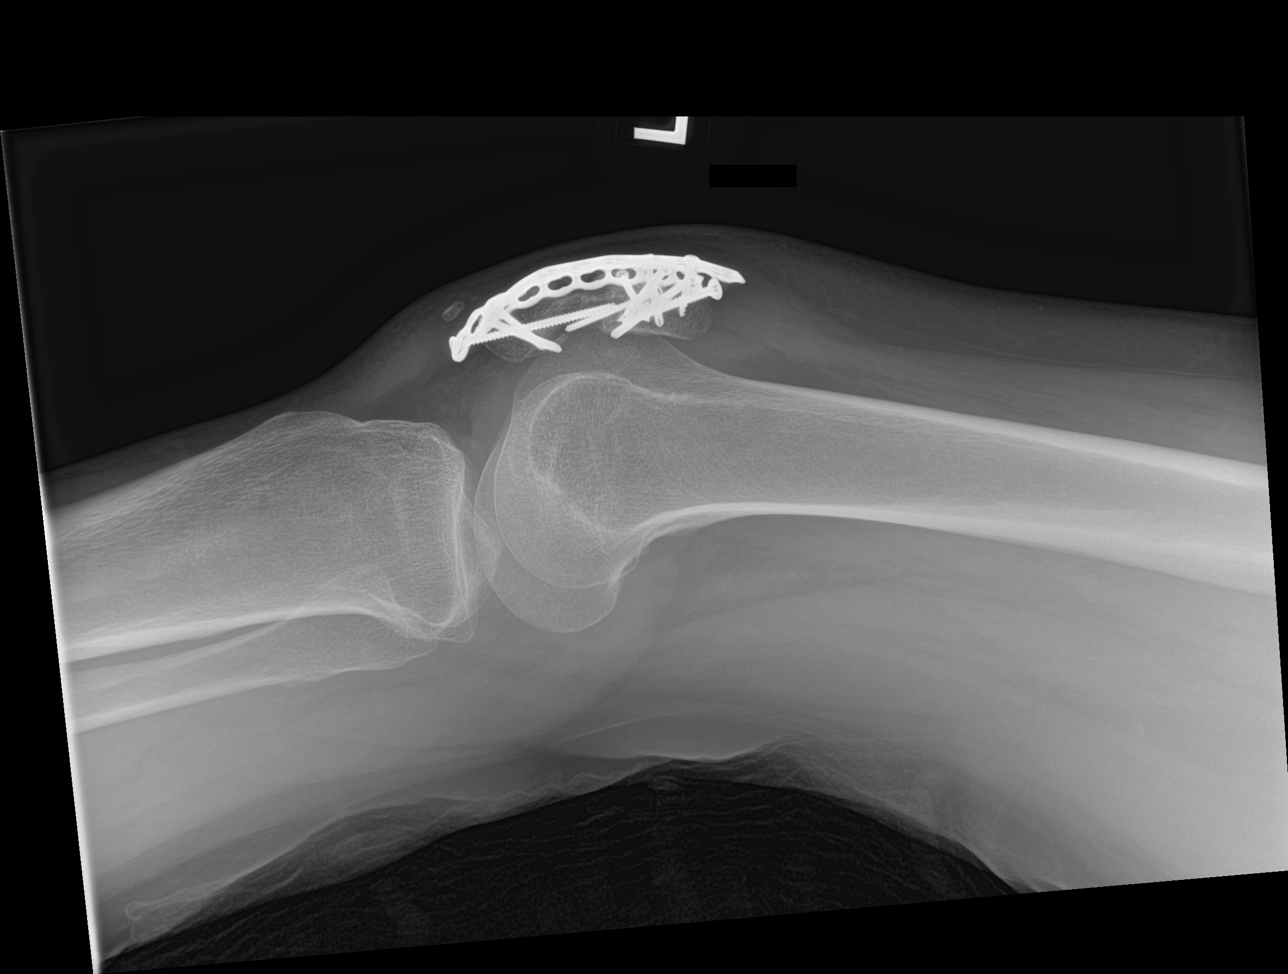

[4 of 4 positions shown; findings below may reference images not displayed]

FINDINGS: There is no acute fracture or dislocation. Prior internal fixation
of comminuted patellar fracture. The bones are well mineralized. No
arthritic changes. There is a small suprapatellar effusion. The soft
tissues are unremarkable.
IMPRESSION: 1. No acute fracture or dislocation.
2. Prior internal fixation of comminuted patellar fracture.

## 2021-08-06 ENCOUNTER — Ambulatory Visit: Payer: 59

## 2021-08-15 ENCOUNTER — Encounter: Payer: Self-pay | Admitting: Nurse Practitioner

## 2021-08-15 ENCOUNTER — Ambulatory Visit (LOCAL_COMMUNITY_HEALTH_CENTER): Payer: 59 | Admitting: Nurse Practitioner

## 2021-08-15 VITALS — BP 107/73 | Ht 62.0 in | Wt 156.8 lb

## 2021-08-15 DIAGNOSIS — Z3200 Encounter for pregnancy test, result unknown: Secondary | ICD-10-CM

## 2021-08-15 DIAGNOSIS — Z01419 Encounter for gynecological examination (general) (routine) without abnormal findings: Secondary | ICD-10-CM

## 2021-08-15 DIAGNOSIS — Z3202 Encounter for pregnancy test, result negative: Secondary | ICD-10-CM

## 2021-08-15 DIAGNOSIS — Z30013 Encounter for initial prescription of injectable contraceptive: Secondary | ICD-10-CM

## 2021-08-15 DIAGNOSIS — Z1331 Encounter for screening for depression: Secondary | ICD-10-CM

## 2021-08-15 DIAGNOSIS — Z3009 Encounter for other general counseling and advice on contraception: Secondary | ICD-10-CM | POA: Diagnosis not present

## 2021-08-15 DIAGNOSIS — Z113 Encounter for screening for infections with a predominantly sexual mode of transmission: Secondary | ICD-10-CM

## 2021-08-15 DIAGNOSIS — Z3042 Encounter for surveillance of injectable contraceptive: Secondary | ICD-10-CM

## 2021-08-15 LAB — WET PREP FOR TRICH, YEAST, CLUE
Trichomonas Exam: NEGATIVE
Yeast Exam: NEGATIVE

## 2021-08-15 LAB — PREGNANCY, URINE: Preg Test, Ur: NEGATIVE

## 2021-08-15 MED ORDER — MEDROXYPROGESTERONE ACETATE 150 MG/ML IM SUSP
150.0000 mg | INTRAMUSCULAR | Status: AC
  Administered 2021-08-15 – 2022-09-12 (×4): 150 mg via INTRAMUSCULAR

## 2021-08-15 NOTE — Progress Notes (Signed)
Patient here for depo shot, PE, STD testing and pap. Depo shot given in Left deltoid. PCP list provided. Appt reminder card given. Non-latex condoms given.  ?
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
## 2021-08-23 LAB — IGP, APTIMA HPV
HPV Aptima: NEGATIVE
PAP Smear Comment: 0

## 2021-08-31 ENCOUNTER — Telehealth: Payer: Self-pay

## 2021-08-31 NOTE — Telephone Encounter (Signed)
Telephone call to patient this morning regarding her PAP results and the need for a Colpo per Glenna Fellows, NP.  PAP showed ASCUS and HPV Negative.  Patient desires her Colpo to [facility].  [facility] referral completed today with confirmation.  Salli Real interpreted the call today.  Hart Carwin, RN ? ?

## 2021-09-20 ENCOUNTER — Ambulatory Visit: Payer: 59 | Admitting: Licensed Clinical Social Worker

## 2021-11-08 ENCOUNTER — Telehealth: Payer: Self-pay

## 2021-11-08 NOTE — Telephone Encounter (Signed)
Telephone call to patient today regarding her no show for Colpo at [facility] 11-05-21 at 3:30 pm..  Phone states "caller not available at this time".  Language Line used today.  Donald Pore / [phone].  Hart Carwin, RN

## 2021-11-08 NOTE — Progress Notes (Signed)
Per [facility], patient no showed for her Colpo on 11-05-2021 at 3:30 pm.

## 2021-11-08 NOTE — Telephone Encounter (Signed)
Missed Colpo letter mailed today.  Hart Carwin, RN

## 2021-11-14 ENCOUNTER — Ambulatory Visit (LOCAL_COMMUNITY_HEALTH_CENTER): Payer: 59

## 2021-11-14 VITALS — BP 109/75 | Ht 62.0 in | Wt 160.0 lb

## 2021-11-14 DIAGNOSIS — Z3042 Encounter for surveillance of injectable contraceptive: Secondary | ICD-10-CM | POA: Diagnosis not present

## 2021-11-14 DIAGNOSIS — Z3009 Encounter for other general counseling and advice on contraception: Secondary | ICD-10-CM

## 2021-11-14 NOTE — Progress Notes (Signed)
13 weeks 0 days post depo. Lang line [phone].   Voices no concerns. Depo given today per order by Onalee Hua, FNP dated 08/15/2021. Tolerated well L delt. ( Arm preference per pt).   Pt missed colpo appt on 11/05/2021. Phone call to B Amash, RN  (pap f-u nurse) who recommends pt to call to reschedule colpo [phone]) and to emphasize impt of keeping appt.   RN discussed this with pt and phone # given. Pt verbalizes understanding and questions were answered.  Next depo due 01/30/2022, has reminder. Jerel Shepherd, RN

## 2022-02-05 ENCOUNTER — Ambulatory Visit: Payer: 59

## 2022-02-05 ENCOUNTER — Ambulatory Visit: Payer: Self-pay

## 2022-02-14 ENCOUNTER — Emergency Department: Payer: Self-pay

## 2022-02-14 ENCOUNTER — Emergency Department
Admission: EM | Admit: 2022-02-14 | Discharge: 2022-02-14 | Disposition: A | Discharge status: Home / Self Care | Payer: Self-pay | Attending: Emergency Medicine | Admitting: Emergency Medicine

## 2022-02-14 ENCOUNTER — Other Ambulatory Visit: Payer: Self-pay

## 2022-02-14 DIAGNOSIS — M25461 Effusion, right knee: Secondary | ICD-10-CM | POA: Insufficient documentation

## 2022-02-14 DIAGNOSIS — M25562 Pain in left knee: Secondary | ICD-10-CM

## 2022-02-14 MED ORDER — KETOROLAC TROMETHAMINE 10 MG PO TABS: 10.0000 mg | ORAL_TABLET | Freq: Four times a day (QID) | ORAL | 0 refills | Status: DC | PRN

## 2022-02-14 MED ORDER — KETOROLAC TROMETHAMINE 30 MG/ML IJ SOLN
30.0000 mg | Freq: Once | INTRAMUSCULAR | Status: AC
  Administered 2022-02-14: 30 mg via INTRAMUSCULAR
  Filled 2022-02-14: qty 1
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
## 2022-03-13 ENCOUNTER — Encounter: Payer: Self-pay | Admitting: Primary Care

## 2022-03-13 ENCOUNTER — Other Ambulatory Visit: Payer: Self-pay

## 2022-03-13 DIAGNOSIS — N6320 Unspecified lump in the left breast, unspecified quadrant: Secondary | ICD-10-CM

## 2022-03-29 ENCOUNTER — Ambulatory Visit (LOCAL_COMMUNITY_HEALTH_CENTER): Payer: Self-pay | Admitting: Nurse Practitioner

## 2022-03-29 VITALS — BP 107/69 | Ht 62.0 in | Wt 162.0 lb

## 2022-03-29 DIAGNOSIS — Z3202 Encounter for pregnancy test, result negative: Secondary | ICD-10-CM

## 2022-03-29 DIAGNOSIS — Z3042 Encounter for surveillance of injectable contraceptive: Secondary | ICD-10-CM

## 2022-03-29 DIAGNOSIS — Z3009 Encounter for other general counseling and advice on contraception: Secondary | ICD-10-CM

## 2022-03-29 LAB — PREGNANCY, URINE: Preg Test, Ur: NEGATIVE

## 2022-03-29 MED ORDER — ELLA 30 MG PO TABS: 1.0000 | ORAL_TABLET | Freq: Once | ORAL | 0 refills | Status: AC

## 2022-03-29 NOTE — Progress Notes (Signed)
[facility] problem visit  [facility]  Subjective:  Lisa [name] is a 31 y.o. being seen today to receive her Depo.  Last Depo was given on 11/14/21 ([redacted]w[redacted]d).    Chief Complaint  Patient presents with   Contraception    Late depo    HPI   Does the patient have a current or past history of drug use? No      Health Maintenance Due  Topic Date Due   COVID-19 Vaccine (1) Never done   HPV VACCINES (2 - 3-dose series) 04/22/2007   INFLUENZA VACCINE  Never done    Review of Systems  Constitutional:  Negative for chills, fever, malaise/fatigue and weight loss.  HENT:  Negative for congestion, hearing loss and sore throat.   Eyes:  Negative for blurred vision, double vision and photophobia.  Respiratory:  Negative for shortness of breath.   Cardiovascular:  Negative for chest pain.  Gastrointestinal:  Negative for abdominal pain, blood in stool, constipation, diarrhea, heartburn, nausea and vomiting.  Genitourinary:  Negative for dysuria and frequency.  Musculoskeletal:  Negative for back pain, joint pain and neck pain.  Skin:  Negative for itching and rash.  Neurological:  Negative for dizziness, weakness and headaches.  Endo/Heme/Allergies:  Does not bruise/bleed easily.  Psychiatric/Behavioral:  Negative for depression, substance abuse and suicidal ideas.     The following portions of the patient's history were reviewed and updated as appropriate: allergies, current medications, past family history, past medical history, past social history, past surgical history and problem list. Problem list updated.   See flowsheet for other program required questions.  Objective:   Vitals:   03/29/22 1541  BP: 107/69  Weight: 162 lb (73.5 kg)  Height: 5\' 2"  (1.575 m)    Physical Exam Constitutional:      Appearance: Normal appearance.  HENT:     Head: Normocephalic. No abrasion, masses or laceration. Hair is normal.     Jaw: No  tenderness or swelling.     Right Ear: External ear normal.     Left Ear: External ear normal.     Nose: Nose normal.     Mouth/Throat:     Lips: Pink. No lesions.     Mouth: Mucous membranes are moist. No lacerations or oral lesions.     Dentition: No dental caries.     Tongue: No lesions.     Palate: No mass and lesions.     Pharynx: No pharyngeal swelling, oropharyngeal exudate, posterior oropharyngeal erythema or uvula swelling.     Tonsils: No tonsillar exudate or tonsillar abscesses.  Pulmonary:     Effort: Pulmonary effort is normal.  Genitourinary:    Vagina: Normal. No vaginal discharge, erythema, tenderness or lesions.     Cervix: No cervical motion tenderness, discharge, lesion or erythema.     Uterus: Normal.      Adnexa:        Right: No tenderness.         Left: No tenderness.       Rectum: Normal.  Musculoskeletal:     Cervical back: Full passive range of motion without pain and normal range of motion.  Skin:    General: Skin is warm and dry.     Findings: No erythema, laceration, lesion or rash.  Neurological:     Mental Status: She is alert and oriented to person, place, and time.  Psychiatric:        Attention and Perception:  Attention normal.        Mood and Affect: Mood normal.        Speech: Speech normal.        Behavior: Behavior normal. Behavior is cooperative.       Assessment and Plan:  Lisa [name] is a 31 y.o. female presenting to the [facility] Department for a Women's Health problem visit  1. Family planning counseling -31 year old female in clinic to receive her Depo.  Last Depo 11/14/21 ([redacted]w[redacted]d).  -ROS reviewed, no complaints. -STD screening offered today, patient declined. -PT today, negative -Plan B offered today, patient accepted.  - Pregnancy, urine - ulipristal acetate (ELLA) 30 MG tablet; Take 1 tablet (30 mg total) by mouth once for 1 dose.  Dispense: 1 tablet; Refill: 0  2. Surveillance for Depo-Provera  contraception -May have Depo per 08/15/21 order Glenna Fellows, FNP.    Return in about 11 weeks (around 06/14/2022) for Routine DMPA injection.  Total time spent: 20 minutes   Glenna Fellows, FNP

## 2022-03-29 NOTE — Progress Notes (Signed)
Pt appointment for late Depo. Seen by FNP White. Depo administered.

## 2022-04-15 ENCOUNTER — Ambulatory Visit: Payer: Self-pay | Attending: Hematology and Oncology

## 2022-04-15 ENCOUNTER — Inpatient Hospital Stay: Admission: RE | Admit: 2022-04-15 | Payer: Self-pay | Source: Ambulatory Visit

## 2022-04-15 ENCOUNTER — Other Ambulatory Visit: Payer: Self-pay

## 2022-05-28 ENCOUNTER — Encounter: Payer: Self-pay | Admitting: Emergency Medicine

## 2022-05-28 ENCOUNTER — Emergency Department: Payer: Self-pay

## 2022-05-28 ENCOUNTER — Emergency Department
Admission: EM | Admit: 2022-05-28 | Discharge: 2022-05-29 | Disposition: A | Discharge status: Home / Self Care | Payer: Self-pay | Attending: Student in an Organized Health Care Education/Training Program | Admitting: Student in an Organized Health Care Education/Training Program

## 2022-05-28 ENCOUNTER — Other Ambulatory Visit: Payer: Self-pay

## 2022-05-28 DIAGNOSIS — M25561 Pain in right knee: Secondary | ICD-10-CM | POA: Insufficient documentation

## 2022-05-28 NOTE — ED Triage Notes (Signed)
Pt to ED from home c/o bilat knee pain and right knee swelling for a couple days.  No known injury.  Pt ambulatory, denies SOB, in NAD at this time.

## 2022-05-29 MED ORDER — HYDROCODONE-ACETAMINOPHEN 5-325 MG PO TABS
2.0000 | ORAL_TABLET | Freq: Once | ORAL | Status: AC
  Administered 2022-05-29: 2 via ORAL
  Filled 2022-05-29: qty 2

## 2022-05-29 MED ORDER — ONDANSETRON 4 MG PO TBDP
4.0000 mg | ORAL_TABLET | Freq: Once | ORAL | Status: AC
  Administered 2022-05-29: 4 mg via ORAL
  Filled 2022-05-29: qty 1

## 2022-05-29 MED ORDER — ONDANSETRON 4 MG PO TBDP: 4.0000 mg | ORAL_TABLET | Freq: Four times a day (QID) | ORAL | 0 refills | Status: DC | PRN

## 2022-05-29 MED ORDER — HYDROCODONE-ACETAMINOPHEN 5-325 MG PO TABS: 1.0000 | ORAL_TABLET | Freq: Four times a day (QID) | ORAL | 0 refills | Status: DC | PRN

## 2022-05-29 NOTE — Discharge Instructions (Addendum)
MRI right knee impression from 05/20/2022:  IMPRESSION:   1. 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  2. No evidence of meniscal injury.  3. Small joint effusion.  4. Small ganglion cystic changes posterior to the medial tibial plateau.    You are being provided a prescription for opiates (also known as narcotics) for pain control.  Opiates can be addictive and should only be used when absolutely necessary for pain control when other alternatives do not work.  We recommend you only use them for the recommended amount of time and only as prescribed.  Please do not take with other sedative medications or alcohol.  Please do not drive, operate machinery, make important decisions while taking opiates.  Please note that these medications can be addictive and have high abuse potential.  Patients can become addicted to narcotics after only taking them for a few days.  Please keep these medications locked away from children, teenagers or any family members with history of substance abuse.  Narcotic pain medicine may also make you constipated.  You may use over-the-counter medications such as MiraLAX, Colace to prevent constipation.  If you become constipated, you may use over-the-counter enemas as needed.  Itching and nausea are also common side effects of narcotic pain medication.  If you develop uncontrolled vomiting or a rash, please stop these medications and seek medical care.  Please call your orthopedic doctor for close outpatient follow-up.    Impresin de resonancia magntica de la rodilla derecha del 12/12/2022:  IMPRESIN:  1. 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 2. No hay evidencia de lesin meniscal. 3. Pequeo derrame articular. 4.  Pequeos cambios qusticos ganglionares posteriores a la meseta tibial medial.   Le estn recetando opiceos (tambin conocidos como narcticos) para Financial controller. Los opiceos pueden ser [employer] y slo deben usarse cuando sea absolutamente necesario para controlar el dolor cuando otras alternativas no funcionan. Le recomendamos que los utilice nicamente durante el tiempo recomendado y segn lo prescrito. No lo tome con otros medicamentos sedantes o alcohol. Por favor, no conduzca, opere maquinaria ni tome decisiones importantes mientras toma opiceos. Tenga en cuenta que estos medicamentos pueden ser [employer] y Raynelle Jan un alto potencial de abuso. Los pacientes pueden volverse adictos a los narcticos despus de tomarlos slo [employer]. Mantenga estos medicamentos bajo llave y fuera del alcance de nios, adolescentes o miembros de la familia con antecedentes de abuso de sustancias. Los analgsicos narcticos tambin Soil scientist. Puede utilizar medicamentos de venta libre como MiraLAX, Colace para prevenir el estreimiento. Si sufre estreimiento, puede utilizar enemas de venta libre segn sea necesario. La picazn y las nuseas tambin son efectos secundarios comunes de los analgsicos narcticos. Si desarrolla vmitos incontrolados o sarpullido, suspenda estos medicamentos y busque atencin mdica.  Llame a su mdico ortopdico para un seguimiento ambulatorio minucioso.

## 2022-05-29 NOTE — ED Provider Notes (Signed)
[facility] Provider Note    Event Date/Time   First MD Initiated Contact with Patient 05/29/22 [phone]     (approximate)   History   Knee Pain   HPI  Lisa [name] is a 32 y.o. female with no significant past medical history other than migraines who presents to the emergency department with several weeks of right knee pain with no recent injury.  Has seen [facility] orthopedics and had an MRI of her knee on 05/20/2022 but states she has not been called with results and does not have a follow-up appointment yet.  States she feels pain that goes into her shin.  She is having a hard time walking due to pain.  No calf tenderness or calf swelling.  No chest pain or shortness of breath.  No fever.  States her right knee has been swollen.  Has been on naproxen, ketorolac without relief.   History provided by patient and significant other using Spanish interpreter.    Past Medical History:  Diagnosis Date   Migraines    Vaginal Pap smear, abnormal     Past Surgical History:  Procedure Laterality Date   APPENDECTOMY     CESAREAN SECTION     CHOLECYSTECTOMY      MEDICATIONS:  Prior to Admission medications   Medication Sig Start Date End Date Taking? Authorizing Provider  acetaminophen (TYLENOL) 325 MG tablet Take 650 mg by mouth every 6 (six) hours as needed for mild pain.    [provider]  aspirin-acetaminophen-caffeine (EXCEDRIN MIGRAINE) [phone] MG tablet Take by mouth every 6 (six) hours as needed for headache. Pt reports takes PRN    [provider]  eletriptan (RELPAX) 20 MG tablet Take 20 mg by mouth as needed for migraine or headache. May repeat in 2 hours if headache persists or recurs.    [provider]  ibuprofen (ADVIL) 200 MG tablet Take 200 mg by mouth every 6 (six) hours as needed for mild pain.    [provider]  ketorolac (TORADOL) 10 MG tablet Take 1 tablet (10 mg total) by mouth every 6 (six) hours  as needed. Patient not taking: Reported on 03/29/2022 02/14/22   Cuthriell, Charline Bills, PA-C  Multiple Vitamins-Minerals (MULTIVITAMIN) tablet Take 1 tablet by mouth daily. Patient not taking: Reported on 03/12/2021 10/28/19   Caren Macadam, MD  naproxen sodium (ALEVE) 220 MG tablet Take 220 mg by mouth daily as needed.    [provider]  norgestimate-ethinyl estradiol (Robinson 28) 0.25-35 MG-MCG tablet Take 1 tablet by mouth daily. Patient not taking: Reported on 08/15/2021 03/12/21   Jerene Dilling, [place]    Physical Exam   Triage Vital Signs: ED Triage Vitals [05/28/22 2235]  Enc Vitals Group     BP 111/78     Pulse Rate 91     Resp 16     Temp 98.7 F (37.1 C)     Temp Source Oral     SpO2 98 %     Weight 154 lb (69.9 kg)     Height 5\' 2"  (1.575 m)     Head Circumference      Peak Flow      Pain Score 8     Pain Loc      Pain Edu?      Excl. in [place]?     Most recent vital signs: Vitals:   05/28/22 2235  BP: 111/78  Pulse: 91  Resp: 16  Temp: 98.7 F (37.1 C)  SpO2: 98%     CONSTITUTIONAL: Alert and responds appropriately to questions. Well-appearing; well-nourished HEAD: Normocephalic, atraumatic EYES: Conjunctivae clear, pupils appear equal ENT: normal nose; moist mucous membranes NECK: Normal range of motion CARD: Regular rate and rhythm RESP: Normal chest excursion without splinting or tachypnea; no hypoxia or respiratory distress, speaking full sentences ABD/GI: non-distended EXT: Patient has soft tissue swelling around the right knee and what appears to be a small joint effusion.  There is no ligamentous laxity.  2+ DP pulses bilaterally.  No calf tenderness or calf swelling.  No bony abnormality.  No redness or warmth.  Compartments soft. SKIN: Normal color for age and race, no rashes on exposed skin NEURO: Moves all extremities equally, normal speech, no facial asymmetry noted PSYCH: The patient's mood and manner are appropriate. Grooming  and personal hygiene are appropriate.  ED Results / Procedures / Treatments   LABS: (all labs ordered are listed, but only abnormal results are displayed) Labs Reviewed - No data to display   EKG:  RADIOLOGY: My personal review and interpretation of imaging: Right knee x-ray shows no acute abnormality.  I have personally reviewed all radiology reports. DG Knee Complete 4 Views Right  Result Date: 05/28/2022 CLINICAL DATA:  knee pain EXAM: RIGHT KNEE - COMPLETE 4+ VIEW COMPARISON:  X-ray right knee 02/14/2022 FINDINGS: No evidence of fracture, dislocation, or joint effusion. No evidence of arthropathy or other focal bone abnormality. Soft tissues are unremarkable. IMPRESSION: Negative. Electronically Signed   By: Iven Finn M.D.   On: 05/28/2022 23:11     PROCEDURES:  Critical Care performed: No    Procedures    IMPRESSION / MDM / ASSESSMENT AND PLAN / ED COURSE  I reviewed the triage vital signs and the nursing notes.   Patient here with right knee pain.     DIFFERENTIAL DIAGNOSIS (includes but not limited to):   Meniscal injury, arthritis, ligamentous injury, doubt fracture, dislocation, septic joint, gout  Patient's presentation is most consistent with acute complicated illness / injury requiring diagnostic workup.  PLAN: X-rays obtained from triage reviewed and interpreted by myself and the radiologist and shows no acute abnormality.  I reviewed the MRI from January 8 from [facility] orthopedics that showed that patient had moderate to severe chondrosis of the patella with deep fissuring and delamination along the medial and lateral patellar facets extending towards the median ridge.  Discussed these findings with patient.  She asked what treatment plan would be.  Discussed with patient that this needs to be a conversation with her orthopedic team.  She does have a knee sleeve at home but is not wearing it.  Recommended wearing something for compression, elevation, ice and  will discharge with short course of narcotic analgesia for pain control given no improvement with NSAIDs.  I have encouraged her strongly to call her orthopedist office to schedule close outpatient follow-up.   MEDICATIONS GIVEN IN ED: Medications  HYDROcodone-acetaminophen (NORCO/VICODIN) 5-325 MG per tablet 2 tablet (2 tablets Oral Given 05/29/22 0140)  ondansetron (ZOFRAN-ODT) disintegrating tablet 4 mg (4 mg Oral Given 05/29/22 0140)     ED COURSE:  At this time, I do not feel there is any life-threatening condition present. I reviewed all nursing notes, vitals, pertinent previous records.  All lab and urine results, EKGs, imaging ordered have been independently reviewed and interpreted by myself.  I reviewed all available radiology reports from any imaging ordered this visit.  Based on my assessment,  I feel the patient is safe to be discharged home without further emergent workup and can continue workup as an outpatient as needed. Discussed all findings, treatment plan as well as usual and customary return precautions.  They verbalize understanding and are comfortable with this plan.  Outpatient follow-up has been provided as needed.  All questions have been answered.    CONSULTS:  none   OUTSIDE RECORDS REVIEWED: Reviewed recent orthopedic notes from [facility].     FINAL CLINICAL IMPRESSION(S) / ED DIAGNOSES   Final diagnoses:  Acute pain of right knee     Rx / DC Orders   ED Discharge Orders          Ordered    HYDROcodone-acetaminophen (NORCO/VICODIN) 5-325 MG tablet  Every 6 hours PRN        05/29/22 0118    ondansetron (ZOFRAN-ODT) 4 MG disintegrating tablet  Every 6 hours PRN        05/29/22 0118             Note:  This document was prepared using Dragon voice recognition software and may include unintentional dictation errors.   Nayellie Sanseverino, Layla Maw, DO 05/29/22 [phone]

## 2022-06-13 ENCOUNTER — Ambulatory Visit (LOCAL_COMMUNITY_HEALTH_CENTER): Payer: Self-pay

## 2022-06-13 VITALS — BP 106/78 | Ht 62.0 in | Wt 164.0 lb

## 2022-06-13 DIAGNOSIS — Z308 Encounter for other contraceptive management: Secondary | ICD-10-CM

## 2022-06-13 DIAGNOSIS — Z3042 Encounter for surveillance of injectable contraceptive: Secondary | ICD-10-CM

## 2022-06-13 DIAGNOSIS — Z30013 Encounter for initial prescription of injectable contraceptive: Secondary | ICD-10-CM

## 2022-06-13 NOTE — Progress Notes (Signed)
Client 10 week and 6 days post depo.  Voices no concerns.  She reports considering implant instead of depo. Client knows to be specific when she calls for next appointment and to let them know that she wants to change methods so that she can be scheduled to see a provider.  Client is aware that she is also due for her physical in April. Depo given today per order by Gregary Cromer NP dated 08/15/2021.  Tolerated well in Right Deltoid.   Mi Balla Shelda Pal, RN

## 2022-06-18 ENCOUNTER — Other Ambulatory Visit: Payer: Self-pay

## 2022-06-18 ENCOUNTER — Ambulatory Visit: Payer: Self-pay

## 2022-09-12 ENCOUNTER — Ambulatory Visit (LOCAL_COMMUNITY_HEALTH_CENTER): Payer: Self-pay

## 2022-09-12 ENCOUNTER — Ambulatory Visit: Payer: Self-pay

## 2022-09-12 VITALS — BP 103/72 | Ht 62.0 in | Wt 171.0 lb

## 2022-09-12 DIAGNOSIS — Z309 Encounter for contraceptive management, unspecified: Secondary | ICD-10-CM

## 2022-09-12 DIAGNOSIS — Z3042 Encounter for surveillance of injectable contraceptive: Secondary | ICD-10-CM

## 2022-09-12 DIAGNOSIS — Z719 Counseling, unspecified: Secondary | ICD-10-CM

## 2022-09-12 DIAGNOSIS — Z23 Encounter for immunization: Secondary | ICD-10-CM

## 2022-09-12 NOTE — Progress Notes (Signed)
13 weeks post hormonal injection.  Medroxyprogesterone Acetate 150 mg given per order dated 08/15/2021 by Glenna Fellows, FNP and standing order per K. Alvester Morin, MD. Left deltoid.  Tolerated well. Consent signed 07/19/2020. Reminder card provided for next hormonal injection due 11/28/2022 and discussed patient needed to keep appointment for PE due 10/02/2022.

## 2022-10-02 ENCOUNTER — Other Ambulatory Visit: Payer: Self-pay

## 2022-10-02 ENCOUNTER — Ambulatory Visit: Payer: Self-pay | Admitting: Family Medicine

## 2022-10-02 ENCOUNTER — Encounter: Payer: Self-pay | Admitting: Family Medicine

## 2022-10-02 VITALS — BP 101/69 | Ht 62.0 in | Wt 172.2 lb

## 2022-10-02 DIAGNOSIS — N871 Moderate cervical dysplasia: Secondary | ICD-10-CM

## 2022-10-02 DIAGNOSIS — Z01419 Encounter for gynecological examination (general) (routine) without abnormal findings: Secondary | ICD-10-CM

## 2022-10-02 DIAGNOSIS — Z3009 Encounter for other general counseling and advice on contraception: Secondary | ICD-10-CM

## 2022-10-02 DIAGNOSIS — N6452 Nipple discharge: Secondary | ICD-10-CM

## 2022-10-02 DIAGNOSIS — Z113 Encounter for screening for infections with a predominantly sexual mode of transmission: Secondary | ICD-10-CM

## 2022-10-02 DIAGNOSIS — F331 Major depressive disorder, recurrent, moderate: Secondary | ICD-10-CM

## 2022-10-02 LAB — WET PREP FOR TRICH, YEAST, CLUE
Trichomonas Exam: NEGATIVE
Yeast Exam: NEGATIVE

## 2022-10-02 LAB — HM HIV SCREENING LAB: HM HIV Screening: NEGATIVE
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
## 2022-10-02 NOTE — Progress Notes (Signed)
Wet prep negative, no treatment indicated.Burt Knack, RN

## 2022-10-02 NOTE — Progress Notes (Signed)
Patient here for annual PE. Last PE was 08/15/21. Last Pap 08/15/21 and was ASCUS. Patient was referred to [facility] and did not keep appointment due to lapse in [facility]. She states she has been trying to talk with someone at [facility] about signing up for Financial assistance again so she can follow -up on Pap. Desires HIV/RPR testing today.Burt Knack, RN

## 2022-10-14 LAB — IGP, APTIMA HPV
HPV Aptima: NEGATIVE
PAP Smear Comment: 0

## 2022-10-24 ENCOUNTER — Other Ambulatory Visit: Payer: Self-pay

## 2022-10-24 NOTE — Progress Notes (Signed)
Pt called asking for upcoming appt details. Called back and no answer. Unable to leave message.

## 2022-10-28 ENCOUNTER — Other Ambulatory Visit: Payer: Self-pay

## 2022-10-28 ENCOUNTER — Telehealth: Payer: Self-pay | Admitting: Hematology and Oncology

## 2022-10-28 ENCOUNTER — Ambulatory Visit: Payer: Self-pay | Attending: Hematology and Oncology

## 2022-10-28 ENCOUNTER — Encounter: Payer: Self-pay | Admitting: Hematology and Oncology

## 2022-10-28 ENCOUNTER — Ambulatory Visit: Payer: Self-pay

## 2022-12-02 NOTE — Addendum Note (Signed)
Addended by: Heywood Bene on: 12/02/2022 09:11 AM   Modules accepted: Orders

## 2022-12-09 ENCOUNTER — Ambulatory Visit
Admission: RE | Admit: 2022-12-09 | Discharge: 2022-12-09 | Disposition: A | Discharge status: Home / Self Care | Payer: Self-pay | Source: Ambulatory Visit | Attending: Obstetrics and Gynecology | Admitting: Obstetrics and Gynecology

## 2022-12-09 ENCOUNTER — Ambulatory Visit: Payer: Self-pay | Attending: Hematology and Oncology | Admitting: Hematology and Oncology

## 2022-12-09 VITALS — BP 106/72 | Wt 163.8 lb

## 2022-12-09 DIAGNOSIS — N6320 Unspecified lump in the left breast, unspecified quadrant: Secondary | ICD-10-CM | POA: Insufficient documentation

## 2022-12-09 DIAGNOSIS — N6452 Nipple discharge: Secondary | ICD-10-CM | POA: Insufficient documentation

## 2022-12-09 NOTE — Progress Notes (Signed)
[name] is a 32 y.o. female who presents to [facility] Main clinic today with complaint of left breast lump.    Pap Smear: Pap not smear completed today. Last Pap smear was 10/02/2022 at [facility] clinic and was normal. Per patient has history of an abnormal Pap smear. Last Pap smear result is available in Epic. She is being followed by Lenice Llamas, NP and was advised to repeat Pap in one year. She is currently 4 months pregnant.    Physical exam: Breasts Breasts symmetrical. No skin abnormalities bilateral breasts. No nipple retraction bilateral breasts. No nipple discharge bilateral breasts. No lymphadenopathy. No lumps palpated bilateral breasts.       Pelvic/Bimanual Pap is not indicated today    Smoking History: Patient has never smoked and was not referred to quit line.    Patient Navigation: Patient education provided. Access to services provided for patient through BCCCP program. Lowella Fairy interpreter provided. No transportation provided   Colorectal Cancer Screening: Per patient has never had colonoscopy completed No complaints today.    Breast and Cervical Cancer Risk Assessment: Patient does not have family history of breast cancer, known genetic mutations, or radiation treatment to the chest before age 5. Patient has history of cervical dysplasia, immunocompromised, or DES exposure in-utero.  Risk Scores   No risk assessment data     A: BCCCP exam without pap smear Complaint of left breast mass with benign exam.   P: Referred patient to the [facility] for a diagnostic mammogram. Appointment scheduled 12/09/2022.  Pascal Lux, NP 12/09/2022 12:48 PM

## 2022-12-09 NOTE — Patient Instructions (Signed)
Taught Lisa [name] about self breast awareness and gave educational materials to take home. Patient did not need a Pap smear today due to last Pap smear was in 10/02/2022 per patient. Let her know B[facility]CP will cover Pap smears every 5 years unless has a history of abnormal Pap smears. Referred patient to the [facility] for diagnostic mammogram. Appointment scheduled for 12/09/22. Patient aware of appointment and will be there. Let patient know will follow up with her within the next couple weeks with results. Lisa [name] verbalized understanding.  Pascal Lux, NP 1:21 PM

## 2023-02-28 ENCOUNTER — Ambulatory Visit (LOCAL_COMMUNITY_HEALTH_CENTER): Payer: Medicaid Other

## 2023-02-28 VITALS — BP 108/63 | Ht 62.0 in | Wt 163.5 lb

## 2023-02-28 DIAGNOSIS — Z3009 Encounter for other general counseling and advice on contraception: Secondary | ICD-10-CM | POA: Diagnosis not present

## 2023-02-28 DIAGNOSIS — Z30012 Encounter for prescription of emergency contraception: Secondary | ICD-10-CM

## 2023-02-28 MED ORDER — ELLA 30 MG PO TABS
1.0000 | ORAL_TABLET | Freq: Once | ORAL | Status: AC
Start: 2023-02-28 — End: 2023-02-28

## 2023-02-28 NOTE — Progress Notes (Signed)
In nurse clinic requesting ECP. Unprotected sex 2 days ago. Sex before then was 4 months ago. LMP 02/18/2023.   The patient was dispensed Samson Frederic  today per SO Dr Lorrin Mais. I provided counseling today regarding the medication. We discussed the medication, the side effects and when to call clinic. Patient given the opportunity to ask questions. Questions answered.    ECP consent signed.  ECP info sheet reviewed and given.  Scheduled 03/06/2023 for [facility] appt to start depo.  Lang line [phone]. Jerel Shepherd, RN

## 2023-03-06 ENCOUNTER — Ambulatory Visit (LOCAL_COMMUNITY_HEALTH_CENTER): Payer: Medicaid Other | Admitting: Family Medicine

## 2023-03-06 VITALS — BP 105/73 | HR 77 | Wt 164.0 lb

## 2023-03-06 DIAGNOSIS — Z3009 Encounter for other general counseling and advice on contraception: Secondary | ICD-10-CM | POA: Diagnosis not present

## 2023-03-06 DIAGNOSIS — Z3202 Encounter for pregnancy test, result negative: Secondary | ICD-10-CM

## 2023-03-06 DIAGNOSIS — Z30013 Encounter for initial prescription of injectable contraceptive: Secondary | ICD-10-CM | POA: Diagnosis not present

## 2023-03-06 LAB — PREGNANCY, URINE: Preg Test, Ur: NEGATIVE

## 2023-03-06 MED ORDER — MEDROXYPROGESTERONE ACETATE 150 MG/ML IM SUSY
PREFILLED_SYRINGE | INTRAMUSCULAR | Status: DC
Start: 2023-03-06 — End: 2023-12-10

## 2023-03-06 NOTE — Progress Notes (Signed)
   [facility]  Subjective:  Lisa [name] is a 32 y.o. being seen today for   Chief Complaint  Patient presents with   Contraception    Restart depo    HPI   Pt reports to clinic to restart depo. I reviewed her chart. She was seen in May for depo- PT was not done at this visit due to patient being within 13 weeks of depo window. Pt regularly got depo every 11-13 weeks. Pt became pregnant during depo injections. 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 Pt desires to restart depo today. I counseled her that we can continue depo if desired, however given she got pregnant on it this year, I reviewed other BCM including LARCs Pt reports she believes the depo didn't work because she drank a special tea for cramping that her mom gave her.  We briefly discussed mood-and I encouraged patient to seek counseling if she was having any issues coping. Pt declined direct referral today, but accepted card for LCSW.   Health Maintenance Due  Topic Date Due   HPV VACCINES (2 - 3-dose series) 04/22/2007   DTaP/Tdap/Td (2 - Td or Tdap) 10/17/2022   INFLUENZA VACCINE  Never done   COVID-19 Vaccine (1 - 2023-24 season) Never done    ROS  The following portions of the patient's history were reviewed and updated as appropriate: allergies, current medications, past family history, past medical history, past social history, past surgical history and problem list. Problem list updated.   See flowsheet for other program required questions.  Objective:   Vitals:   03/06/23 1118  BP: 105/73  Pulse: 77  Weight: 164 lb (74.4 kg)    Physical Exam  defered  Assessment and Plan:  Lisa [name] is a 32 y.o. female presenting to the [facility] Department for a Women's Health problem visit  1. Family planning -PT negative today -ok to proceed with depo  - Pregnancy,  urine     Return in about 3 months (around 06/06/2023) for depo injection.  No future appointments. Due to language barrier, interpreter Byrd Hesselbach [phone]) was present for this visit.  Lisa [name], [place]

## 2023-03-06 NOTE — Addendum Note (Signed)
Addended by: Monna Fam R on: 03/06/2023 02:06 PM   Modules accepted: Orders

## 2023-03-06 NOTE — Progress Notes (Signed)
Pt is here for depo, injection given in L deltoid. Reminder card and Birth control handouts given to pt along with Kathreen Cosier card. Latex free condoms given to pt. Gaspar Garbe, RN

## 2023-03-08 DIAGNOSIS — M25561 Pain in right knee: Secondary | ICD-10-CM | POA: Diagnosis present

## 2023-03-08 DIAGNOSIS — M25461 Effusion, right knee: Secondary | ICD-10-CM | POA: Insufficient documentation

## 2023-03-08 DIAGNOSIS — G8929 Other chronic pain: Secondary | ICD-10-CM | POA: Diagnosis not present
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
## 2023-03-09 ENCOUNTER — Emergency Department: Payer: Medicaid Other

## 2023-03-09 ENCOUNTER — Emergency Department
Admission: EM | Admit: 2023-03-09 | Discharge: 2023-03-09 | Disposition: A | Discharge status: Home / Self Care | Payer: Medicaid Other | Attending: Emergency Medicine | Admitting: Emergency Medicine

## 2023-03-09 DIAGNOSIS — G8929 Other chronic pain: Secondary | ICD-10-CM

## 2023-03-09 DIAGNOSIS — M25461 Effusion, right knee: Secondary | ICD-10-CM

## 2023-03-09 MED ORDER — ACETAMINOPHEN 500 MG PO TABS
1000.0000 mg | ORAL_TABLET | Freq: Once | ORAL | Status: AC
  Administered 2023-03-09: 1000 mg via ORAL
  Filled 2023-03-09: qty 2

## 2023-03-09 MED ORDER — DEXAMETHASONE 10 MG/ML FOR PEDIATRIC ORAL USE
10.0000 mg | Freq: Once | INTRAMUSCULAR | Status: AC
Start: 2023-03-09 — End: 2023-03-09
  Administered 2023-03-09: 10 mg via ORAL
  Filled 2023-03-09: qty 1
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
## 2023-05-15 ENCOUNTER — Emergency Department: Payer: Medicaid Other

## 2023-05-15 ENCOUNTER — Emergency Department
Admission: EM | Admit: 2023-05-15 | Discharge: 2023-05-15 | Disposition: A | Discharge status: Home / Self Care | Payer: Medicaid Other | Attending: Emergency Medicine | Admitting: Emergency Medicine

## 2023-05-15 ENCOUNTER — Other Ambulatory Visit: Payer: Self-pay

## 2023-05-15 DIAGNOSIS — Z7982 Long term (current) use of aspirin: Secondary | ICD-10-CM | POA: Insufficient documentation

## 2023-05-15 DIAGNOSIS — M25562 Pain in left knee: Secondary | ICD-10-CM | POA: Diagnosis present

## 2023-05-15 MED ORDER — IBUPROFEN 800 MG PO TABS
800.0000 mg | ORAL_TABLET | Freq: Once | ORAL | Status: AC
  Administered 2023-05-15: 800 mg via ORAL
  Filled 2023-05-15: qty 1

## 2023-05-15 MED ORDER — ONDANSETRON 4 MG PO TBDP
4.0000 mg | ORAL_TABLET | Freq: Once | ORAL | Status: AC
  Administered 2023-05-15: 4 mg via ORAL
  Filled 2023-05-15: qty 1

## 2023-05-15 MED ORDER — HYDROCODONE-ACETAMINOPHEN 5-325 MG PO TABS
2.0000 | ORAL_TABLET | Freq: Once | ORAL | Status: AC
  Administered 2023-05-15: 2 via ORAL
  Filled 2023-05-15: qty 2

## 2023-05-15 MED ORDER — ONDANSETRON 4 MG PO TBDP: 4.0000 mg | ORAL_TABLET | Freq: Four times a day (QID) | ORAL | 0 refills | Status: AC | PRN

## 2023-05-15 MED ORDER — IBUPROFEN 800 MG PO TABS: 800.0000 mg | ORAL_TABLET | Freq: Three times a day (TID) | ORAL | 0 refills | Status: AC | PRN

## 2023-05-15 MED ORDER — HYDROCODONE-ACETAMINOPHEN 5-325 MG PO TABS: 2.0000 | ORAL_TABLET | Freq: Three times a day (TID) | ORAL | 0 refills | Status: DC | PRN

## 2023-05-15 NOTE — ED Notes (Signed)
 Ace wrap applied to L knee and crutches provided. Brief crutch training completed due to patient already knowing how to use crutches and was able to demonstrate correct use.
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
## 2023-05-15 NOTE — ED Provider Notes (Signed)
 [facility] Provider Note    Event Date/Time   First MD Initiated Contact with Patient 05/15/23 [phone]     (approximate)   History   Leg Pain   HPI  Lisa [name] is a 33 y.o. female with history of migraine headaches who presents to the emergency department left knee pain.  Reports several days ago she twisted her knee.  Has had pain ever since it has progressively worsened with soft tissue swelling.  No calf tenderness.  Has a hard time bearing weight on this leg.  Has had previous knee surgery on this knee at [facility].   History provided by patient, significant other using Spanish interpreter.    Past Medical History:  Diagnosis Date   Migraines    Vaginal Pap smear, abnormal     Past Surgical History:  Procedure Laterality Date   APPENDECTOMY     CESAREAN SECTION     CHOLECYSTECTOMY      MEDICATIONS:  Prior to Admission medications   Medication Sig Start Date End Date Taking? Authorizing Provider  acetaminophen  (TYLENOL ) 325 MG tablet Take 650 mg by mouth every 6 (six) hours as needed for mild pain. Patient not taking: Reported on 02/28/2023    [provider]  aspirin-acetaminophen -caffeine (EXCEDRIN MIGRAINE) 250-250-65 MG tablet Take by mouth every 6 (six) hours as needed for headache. Pt reports takes PRN Patient not taking: Reported on 02/28/2023    [provider]  eletriptan (RELPAX) 20 MG tablet Take 20 mg by mouth as needed for migraine or headache. May repeat in 2 hours if headache persists or recurs.    [provider]  HYDROcodone -acetaminophen  (NORCO/VICODIN) 5-325 MG tablet Take 1 tablet by mouth every 6 (six) hours as needed. Patient not taking: Reported on 02/28/2023 05/29/22   Pascual Mantel, Josette SAILOR, DO  ibuprofen  (ADVIL ) 200 MG tablet Take 200 mg by mouth every 6 (six) hours as needed for mild pain.    [provider]  ketorolac  (TORADOL ) 10 MG tablet Take 1 tablet (10 mg total) by mouth  every 6 (six) hours as needed. Patient not taking: Reported on 03/29/2022 02/14/22   Cuthriell, Dorn BIRCH, PA-C  Multiple Vitamins-Minerals (MULTIVITAMIN) tablet Take 1 tablet by mouth daily. Patient not taking: Reported on 03/12/2021 10/28/19   Eldonna Suzen Octave, MD  naproxen sodium (ALEVE) 220 MG tablet Take 220 mg by mouth daily as needed. Patient not taking: Reported on 10/02/2022    [provider]  norgestimate -ethinyl estradiol  (SPRINTEC 28) 0.25-35 MG-MCG tablet Take 1 tablet by mouth daily. Patient not taking: Reported on 08/15/2021 03/12/21   Enola Tobias PARAS, PA  ondansetron  (ZOFRAN -ODT) 4 MG disintegrating tablet Take 1 tablet (4 mg total) by mouth every 6 (six) hours as needed for nausea or vomiting. Patient not taking: Reported on 10/02/2022 05/29/22   Ardie Dragoo, Josette SAILOR, DO    Physical Exam   Triage Vital Signs: ED Triage Vitals  Encounter Vitals Group     BP 05/15/23 0057 114/78     Systolic BP Percentile --      Diastolic BP Percentile --      Pulse Rate 05/15/23 0057 75     Resp 05/15/23 0057 18     Temp 05/15/23 0057 98.2 F (36.8 C)     Temp Source 05/15/23 0057 Oral     SpO2 05/15/23 0057 99 %     Weight 05/15/23 0057 162 lb (73.5 kg)     Height 05/15/23 0057 5' 2 (  1.575 m)     Head Circumference --      Peak Flow --      Pain Score 05/15/23 0056 8     Pain Loc --      Pain Education --      Exclude from Growth Chart --     Most recent vital signs: Vitals:   05/15/23 0057  BP: 114/78  Pulse: 75  Resp: 18  Temp: 98.2 F (36.8 C)  SpO2: 99%     CONSTITUTIONAL: Alert and responds appropriately to questions. Well-appearing; well-nourished HEAD: Normocephalic, atraumatic EYES: Conjunctivae clear, pupils appear equal ENT: normal nose; moist mucous membranes NECK: Normal range of motion CARD: Regular rate and rhythm RESP: Normal chest excursion without splinting or tachypnea; no hypoxia or respiratory distress, speaking full sentences ABD/GI:  non-distended EXT: Tender over the left anterior knee with mild soft tissue swelling.  No significant joint effusion.  No ligamentous laxity.  No tenderness over the hip, ankle or foot.  2+ left DP pulse.  No calf tenderness or calf swelling.  Compartments soft.  No ecchymosis. SKIN: Normal color for age and race, no rashes on exposed skin NEURO: Moves all extremities equally, normal speech, no facial asymmetry noted PSYCH: The patient's mood and manner are appropriate. Grooming and personal hygiene are appropriate.  ED Results / Procedures / Treatments   LABS: (all labs ordered are listed, but only abnormal results are displayed) Labs Reviewed - No data to display   EKG:  EKG Interpretation Date/Time:    Ventricular Rate:    PR Interval:    QRS Duration:    QT Interval:    QTC Calculation:   R Axis:      Text Interpretation:            RADIOLOGY: My personal review and interpretation of imaging: X-ray shows no acute abnormality.  I have personally reviewed all radiology reports. DG Knee Complete 4 Views Left Result Date: 05/15/2023 CLINICAL DATA:  Recent fall with knee pain, initial encounter EXAM: LEFT KNEE - COMPLETE 4+ VIEW COMPARISON:  02/14/2022 FINDINGS: Postsurgical changes are noted in the patella. No hardware failure is noted. No acute fracture or dislocation is seen. Small joint effusion is noted. IMPRESSION: Postsurgical changes in the patella. No acute bony abnormality is seen. Small joint effusion. Electronically Signed   By: Oneil Devonshire M.D.   On: 05/15/2023 01:22     PROCEDURES:  Critical Care performed: No      Procedures    IMPRESSION / MDM / ASSESSMENT AND PLAN / ED COURSE  I reviewed the triage vital signs and the nursing notes.   Patient here with complaints of left knee pain.     DIFFERENTIAL DIAGNOSIS (includes but not limited to):   Sprain, strain, fracture, ACL tear, MCL tear  Patient's presentation is most consistent with acute  complicated illness / injury requiring diagnostic workup.  PLAN: X-ray obtained from triage and reviewed/interpreted by myself and the radiologist and shows no acute bony abnormality.  No sign of gout, septic arthritis.  No sign of compartment syndrome.  Neurovascular intact distally.  Will place her in an Ace wrap.  Will provide crutches for comfort.  Will discharge with pain medication given outpatient orthopedic follow-up.  Recommended rest, elevation, ice.   MEDICATIONS GIVEN IN ED: Medications  HYDROcodone -acetaminophen  (NORCO/VICODIN) 5-325 MG per tablet 2 tablet (2 tablets Oral Given 05/15/23 0503)  ibuprofen  (ADVIL ) tablet 800 mg (800 mg Oral Given 05/15/23 0504)  ondansetron  (ZOFRAN -ODT) disintegrating  tablet 4 mg (4 mg Oral Given 05/15/23 0504)     ED COURSE:  At this time, I do not feel there is any life-threatening condition present. I reviewed all nursing notes, vitals, pertinent previous records.  All lab and urine results, EKGs, imaging ordered have been independently reviewed and interpreted by myself.  I reviewed all available radiology reports from any imaging ordered this visit.  Based on my assessment, I feel the patient is safe to be discharged home without further emergent workup and can continue workup as an outpatient as needed. Discussed all findings, treatment plan as well as usual and customary return precautions.  They verbalize understanding and are comfortable with this plan.  Outpatient follow-up has been provided as needed.  All questions have been answered.    CONSULTS:  none   OUTSIDE RECORDS REVIEWED: Reviewed patient's previous orthopedic notes at [facility] and September 2020.  Patient had ORIF secondary to a comminuted left patellar fracture.     FINAL CLINICAL IMPRESSION(S) / ED DIAGNOSES   Final diagnoses:  Acute pain of left knee     Rx / DC Orders   ED Discharge Orders          Ordered    HYDROcodone -acetaminophen  (NORCO/VICODIN) 5-325 MG tablet   Every 8 hours PRN        05/15/23 0453    ondansetron  (ZOFRAN -ODT) 4 MG disintegrating tablet  Every 6 hours PRN        05/15/23 0453    ibuprofen  (ADVIL ) 800 MG tablet  Every 8 hours PRN        05/15/23 0453             Note:  This document was prepared using Dragon voice recognition software and may include unintentional dictation errors.   Fizza Scales, Josette SAILOR, DO 05/15/23 [id number]

## 2023-05-15 NOTE — Discharge Instructions (Addendum)
You are being provided a prescription for opiates (also known as narcotics) for pain control.  Opiates can be addictive and should only be used when absolutely necessary for pain control when other alternatives do not work.  We recommend you only use them for the recommended amount of time and only as prescribed.  Please do not take with other sedative medications or alcohol.  Please do not drive, operate machinery, make important decisions while taking opiates.  Please note that these medications can be addictive and have high abuse potential.  Patients can become addicted to narcotics after only taking them for a few days.  Please keep these medications locked away from children, teenagers or any family members with history of substance abuse.  Narcotic pain medicine may also make you constipated.  You may use over-the-counter medications such as MiraLAX, Colace to prevent constipation.  If you become constipated, you may use over-the-counter enemas as needed.  Itching and nausea are also common side effects of narcotic pain medication.  If you develop uncontrolled vomiting or a rash, please stop these medications and seek medical care.  Le estn recetando opiceos (tambin conocidos como narcticos) para Human resources officer. Los opiceos pueden ser [employer] y slo deben usarse cuando sea absolutamente necesario para controlar el dolor cuando otras alternativas no funcionan. Le recomendamos que los utilice nicamente durante el tiempo recomendado y segn lo prescrito. No lo tome con otros medicamentos sedantes o alcohol. Por favor, no conduzca, opere maquinaria ni tome decisiones importantes mientras toma opiceos. Tenga en cuenta que estos medicamentos pueden ser [employer] y Wilburt Finlay un alto potencial de abuso. Los pacientes pueden volverse adictos a los narcticos despus de tomarlos slo [employer]. Mantenga estos medicamentos bajo llave y fuera del alcance de nios, adolescentes o miembros de la familia  con antecedentes de abuso de sustancias. Los analgsicos narcticos tambin Pension scheme manager. Puede utilizar medicamentos de venta libre como MiraLAX, Colace para prevenir el estreimiento. Si sufre estreimiento, puede utilizar enemas de venta libre segn sea necesario. La picazn y las nuseas tambin son efectos secundarios comunes de los analgsicos narcticos. Si desarrolla vmitos incontrolados o sarpullido, suspenda estos medicamentos y busque atencin mdica.

## 2023-05-22 ENCOUNTER — Ambulatory Visit: Payer: Medicaid Other

## 2023-05-22 VITALS — BP 113/77 | Ht 62.0 in | Wt 171.5 lb

## 2023-05-22 DIAGNOSIS — Z3009 Encounter for other general counseling and advice on contraception: Secondary | ICD-10-CM

## 2023-05-22 DIAGNOSIS — Z30013 Encounter for initial prescription of injectable contraceptive: Secondary | ICD-10-CM | POA: Diagnosis not present

## 2023-05-22 DIAGNOSIS — Z309 Encounter for contraceptive management, unspecified: Secondary | ICD-10-CM | POA: Diagnosis not present

## 2023-05-22 DIAGNOSIS — Z3042 Encounter for surveillance of injectable contraceptive: Secondary | ICD-10-CM

## 2023-05-22 NOTE — Progress Notes (Signed)
 11 weeks post depo.  Denies any problems and desires to continue depo.  See flowsheet.  Depo given IM left deltoid per pt's request and ordered 03/06/23 by HILARIO Bers FNP; tolerated well.  Randie Norway interpreted.  Next depo due 08/07/23; appt reminder given.  Shona KATHEE Diesel, RN

## 2023-08-07 ENCOUNTER — Ambulatory Visit

## 2023-08-12 ENCOUNTER — Ambulatory Visit

## 2023-08-12 VITALS — BP 112/68 | Ht 62.0 in | Wt 175.5 lb

## 2023-08-12 DIAGNOSIS — Z30013 Encounter for initial prescription of injectable contraceptive: Secondary | ICD-10-CM

## 2023-08-12 DIAGNOSIS — Z309 Encounter for contraceptive management, unspecified: Secondary | ICD-10-CM

## 2023-08-12 DIAGNOSIS — Z3009 Encounter for other general counseling and advice on contraception: Secondary | ICD-10-CM

## 2023-08-12 DIAGNOSIS — Z3042 Encounter for surveillance of injectable contraceptive: Secondary | ICD-10-CM

## 2023-08-12 NOTE — Progress Notes (Signed)
 11 Weeks   5 Days since last Depo Interpreter: [employer] # [phone] Lars Mage  (Patient speaks some English) Voices no concerns today. Patient is interested in changing birth control methods to IUD.  Appointment is not available today.  Patient told to schedule appointment with women's clinic before Depo due again to discuss IUD.  Material on IUDs and birth control options provided to patient. Counseled to adhere to 11 to 13 week intervals between depo injections for optimal benefit.  Depo given today per order by H.Middleton, FNP  dated 03/05/2013 for1 year  Tolerated well given Right deltoid.Marland Kitchen  Next depo due 10/28/2023 and has reminder card.    Patient stated understanding with scheduling an appointment with women's clinic.

## 2023-11-02 ENCOUNTER — Other Ambulatory Visit: Payer: Self-pay

## 2023-11-02 ENCOUNTER — Emergency Department
Admission: EM | Admit: 2023-11-02 | Discharge: 2023-11-02 | Disposition: A | Discharge status: Home / Self Care | Attending: Emergency Medicine | Admitting: Emergency Medicine

## 2023-11-02 ENCOUNTER — Encounter: Payer: Self-pay | Admitting: Emergency Medicine

## 2023-11-02 DIAGNOSIS — R059 Cough, unspecified: Secondary | ICD-10-CM | POA: Diagnosis present

## 2023-11-02 DIAGNOSIS — G43009 Migraine without aura, not intractable, without status migrainosus: Secondary | ICD-10-CM | POA: Diagnosis not present

## 2023-11-02 DIAGNOSIS — J029 Acute pharyngitis, unspecified: Secondary | ICD-10-CM | POA: Insufficient documentation

## 2023-11-02 LAB — GROUP A STREP BY PCR: Group A Strep by PCR: NOT DETECTED

## 2023-11-02 LAB — RESP PANEL BY RT-PCR (RSV, FLU A&B, COVID)  RVPGX2
Influenza A by PCR: NEGATIVE
Influenza B by PCR: NEGATIVE
Resp Syncytial Virus by PCR: NEGATIVE
SARS Coronavirus 2 by RT PCR: NEGATIVE

## 2023-11-02 MED ORDER — PROMETHAZINE HCL 25 MG PO TABS
25.0000 mg | ORAL_TABLET | Freq: Once | ORAL | Status: AC
  Administered 2023-11-02: 25 mg via ORAL
  Filled 2023-11-02: qty 1

## 2023-11-02 MED ORDER — DIPHENHYDRAMINE HCL 25 MG PO CAPS: 50.0000 mg | ORAL_CAPSULE | Freq: Once | ORAL | Status: DC

## 2023-11-02 MED ORDER — DIPHENHYDRAMINE HCL 50 MG/ML IJ SOLN
25.0000 mg | Freq: Once | INTRAMUSCULAR | Status: AC
  Administered 2023-11-02: 25 mg via INTRAVENOUS
  Filled 2023-11-02: qty 1

## 2023-11-02 MED ORDER — DEXAMETHASONE SODIUM PHOSPHATE 10 MG/ML IJ SOLN
10.0000 mg | Freq: Once | INTRAMUSCULAR | Status: AC
  Administered 2023-11-02: 10 mg via INTRAVENOUS
  Filled 2023-11-02: qty 1

## 2023-11-02 MED ORDER — SUMATRIPTAN SUCCINATE 6 MG/0.5ML ~~LOC~~ SOLN
6.0000 mg | Freq: Once | SUBCUTANEOUS | Status: AC
  Administered 2023-11-02: 6 mg via SUBCUTANEOUS
  Filled 2023-11-02: qty 0.5

## 2023-11-02 MED ORDER — DEXAMETHASONE SODIUM PHOSPHATE 10 MG/ML IJ SOLN: 10.0000 mg | Freq: Once | INTRAMUSCULAR | Status: DC

## 2023-11-02 NOTE — ED Provider Notes (Signed)
 [facility] Provider Note    Event Date/Time   First MD Initiated Contact with Patient 11/02/23 2014     (approximate)   History   Sore Throat, Otalgia, and Headache   HPI  Lisa [name] is a 33 y.o. female presenting to the emergency department with unilateral headache on the left side, nonproductive cough, sore throat, nausea and left ear pain. She also has pain surrounding her left eye. Patient states this has been present for about 3 days.  She states she has had a loss in her voice.  She states her daughter was sick 2 weeks ago but was unsure what she had.  Denies aura, vomiting, diarrhea, abdominal pain, chest pain, shortness of breath, blurry vision, vision changes. No recent illness. She does not currently smoke.  Allergies include diclofenac, amoxicillin, dicyclomine.  Past medical history includes migraines.  Spanish virtual interpreter used for this visit.  Physical Exam   Triage Vital Signs: ED Triage Vitals  Encounter Vitals Group     BP 11/02/23 1936 119/84     Girls Systolic BP Percentile --      Girls Diastolic BP Percentile --      Boys Systolic BP Percentile --      Boys Diastolic BP Percentile --      Pulse Rate 11/02/23 1936 90     Resp 11/02/23 1936 15     Temp 11/02/23 1936 97.9 F (36.6 C)     Temp Source 11/02/23 1936 Oral     SpO2 11/02/23 1936 100 %     Weight 11/02/23 1937 178 lb (80.7 kg)     Height 11/02/23 1937 5' 2 (1.575 m)     Head Circumference --      Peak Flow --      Pain Score 11/02/23 1936 8     Pain Loc --      Pain Education --      Exclude from Growth Chart --     Most recent vital signs: Vitals:   11/02/23 1936 11/02/23 2317  BP: 119/84 122/89  Pulse: 90 88  Resp: 15 16  Temp: 97.9 F (36.6 C)   SpO2: 100% 99%    General: Awake, in no acute distress. Appears stated age. Head: Normocephalic, atraumatic. Eyes: PERRLA. No scleral icterus or conjunctival injection. Ears/Nose/Throat:  TMs intact b/l, right and left ears both with cerumen.  Nares patent, no nasal discharge. Oropharynx moist, no exudate, but is bilaterally erythematous. Dentition intact. No trismus. Neck: Supple, anterior lymphadenopathy present. CV: Regular rate, 90 bpm. Dorsalis pedis pulses 2+ and symmetric. No edema. Respiratory: Breath sounds clear b/l. No wheezes, rales, or rhonchi. No respiratory distress. Normal respiratory effort. GI: Soft, non-distended, non-tender. No rebound or guarding.  Skin:Warm, dry, intact. No rashes, lesions, or ecchymosis. No cyanosis or pallor. Neurological: A&Ox4 to person, place, time, and situation. No focal deficits. Psychiatric: Mood and affect appropriate. Thought processes coherent.   ED Results / Procedures / Treatments   Labs (all labs ordered are listed, but only abnormal results are displayed) Labs Reviewed  GROUP A STREP BY PCR  RESP PANEL BY RT-PCR (RSV, FLU A&B, COVID)  [id number]     EKG    RADIOLOGY    PROCEDURES:  Critical Care performed: No  Procedures   MEDICATIONS ORDERED IN ED: Medications  SUMAtriptan (IMITREX) injection 6 mg (6 mg Subcutaneous Given 11/02/23 2212)  promethazine (PHENERGAN) tablet 25 mg (25 mg Oral Given 11/02/23 2208)  dexamethasone  (  DECADRON ) injection 10 mg (10 mg Intravenous Given 11/02/23 2208)  diphenhydrAMINE (BENADRYL) injection 25 mg (25 mg Intravenous Given 11/02/23 2208)     IMPRESSION / MDM / ASSESSMENT AND PLAN / ED COURSE  I reviewed the triage vital signs and the nursing notes.                              Differential diagnosis includes, but is not limited to, laryngitis, viral pharyngitis, migraine, mononucleosis, COVID, flu, RSV, strep pharyngitis, GERD  Patient's presentation is most consistent with acute complicated illness / injury requiring diagnostic workup.  Patient is a 33 year old female presenting with unilateral headache, left eye pain, sore throat, nonproductive cough, and left ear  pain.  Respiratory panel and strep test ordered in triage, negative for flu, COVID, RSV, and strep pharyngitis. Patient has a history of migraines and states this feels like one of her past migraines.  She was given a migraine cocktail involving Benadryl, Phenergan, Imitrex, and I used dexamethasone  instead of Toradol  due to her diclofenac allergy.  Patient states her pain has decreased since receiving these medications.  I gave her information regarding follow-up with her primary care provider for preventative migraine treatment if needed or if symptoms do not fully resolve.  Due to her hoarseness, cough, and sore throat with negative strep test, I think she is also experiencing a viral pharyngitis.  We discussed using honey with tea, salt water gargles, and lozenges for soothing remedies.  She can use Tylenol  to help with any pain she may be experiencing.  We also discussed adequate hydration, and resting her voice.  She should change her toothbrush after resolution of the virus.  Patient was given the opportunity to ask questions, all questions were answered.  All vital signs within normal range.  Emergency department return precautions were discussed with the patient.  Patient is in agreement to the treatment plan.  Patient is stable for discharge.    FINAL CLINICAL IMPRESSION(S) / ED DIAGNOSES   Final diagnoses:  Migraine without aura and without status migrainosus, not intractable  Viral pharyngitis     Rx / DC Orders   ED Discharge Orders     None        Note:  This document was prepared using Dragon voice recognition software and may include unintentional dictation errors.    Sheron Salm, PA-C 11/02/23 2334    Dorothyann Drivers, MD 11/04/23 1452

## 2023-11-02 NOTE — ED Notes (Signed)
 Pt states she is having pain to her throat and a headache that is different form her normal migraines.  The pt states she has been taking medication without any alleviation.

## 2023-11-02 NOTE — ED Triage Notes (Signed)
 To ER from home for report of sore throat and headache. Symptoms began 3 days ago. Now hoarse voice. Headache began first with left ear pain then sore throat.  Interpreter used during triage.

## 2023-11-02 NOTE — Discharge Instructions (Signed)
 You were seen in the Emergency Department for a migraine.  Please follow up with your primary care provider as soon as possible regarding today's visit and your headache symptoms.  If these migraines continue, please consider making an appointment with a neurologist to help receive preventative migraine treatment.  Call your primary care provider or return to the emergency department if you have any of the following: sudden, severe headache; worsening headache, slurred speech, facial droop, confusion, weakness or numbness in any arm or leg, extreme fatigue, unremitting headache, worsening pain, vomiting, loss of consciousness, food intolerance, fever, vision changes, headache different from prior headaches, or any other symptoms that are new or concern you.     You are also seen for a viral infected throat.  Please use hard candy to help soothe your throat.  Please change out your toothbrush after the pain subsides.  You can use honey and lemon and tea as well as salt water gargles to help with any pain.  Rest your voice. you can take Tylenol  as needed to help with pain.

## 2023-11-11 ENCOUNTER — Encounter: Payer: Self-pay | Admitting: Family Medicine

## 2023-11-11 ENCOUNTER — Ambulatory Visit: Admitting: Family Medicine

## 2023-11-11 VITALS — BP 103/77 | HR 93 | Ht 62.0 in | Wt 171.0 lb

## 2023-11-11 DIAGNOSIS — Z309 Encounter for contraceptive management, unspecified: Secondary | ICD-10-CM | POA: Diagnosis not present

## 2023-11-11 DIAGNOSIS — Z124 Encounter for screening for malignant neoplasm of cervix: Secondary | ICD-10-CM

## 2023-11-11 DIAGNOSIS — Z113 Encounter for screening for infections with a predominantly sexual mode of transmission: Secondary | ICD-10-CM

## 2023-11-11 DIAGNOSIS — Z01419 Encounter for gynecological examination (general) (routine) without abnormal findings: Secondary | ICD-10-CM

## 2023-11-11 LAB — WET PREP FOR TRICH, YEAST, CLUE
Clue Cell Exam: NEGATIVE
Trichomonas Exam: NEGATIVE
Yeast Exam: NEGATIVE

## 2023-11-11 LAB — HM HIV SCREENING LAB: HM HIV Screening: NEGATIVE
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
## 2023-11-11 NOTE — Patient Instructions (Signed)
 STI screening - Today we obtained a vaginal swab to screen for gonorrhea, chlamydia, and trichomonas - We also obtained a blood sample to screen for HIV and syphilis - If the results are abnormal, I will give you a call.   Prueba de ITS - Hoy nos hicieron Lisa [name] hisopado vaginal para Paramedic, clamidia y tricomonas - Tambin nos hicieron una muestra de sangre para detectar VIH and sfilis - Si los resultados son anormales, Engineer, structural.  Estimated time frame for results collected at the [facility] Department: Lisa [name] estimado para obtener los resultados en el [facility] [place] [facility] [place]: Same day El mismo da  Trichomonas Yeast / Vaguda BV (bacterial vaginosis) / VB (vaginosis bacteriana)   Within 1-2 weeks En 1-2 semanas  Gonorrhea / Gonorrea Chlamydia / Clamidia  Within 2-3 weeks En 2-3 semanas HIV / VIH Syphilis / Sfilis Hepatitis B Hepatitis C

## 2023-11-14 LAB — IGP, APTIMA HPV
HPV Aptima: NEGATIVE
PAP Smear Comment: 0

## 2023-11-17 ENCOUNTER — Ambulatory Visit: Payer: Self-pay | Admitting: Family Medicine

## 2023-11-27 ENCOUNTER — Encounter: Payer: Self-pay | Admitting: Family Medicine

## 2023-12-10 ENCOUNTER — Encounter: Payer: Self-pay | Admitting: Family Medicine

## 2023-12-10 ENCOUNTER — Ambulatory Visit: Admitting: Family Medicine

## 2023-12-10 VITALS — BP 108/75 | HR 95 | Ht 62.0 in | Wt 169.0 lb

## 2023-12-10 DIAGNOSIS — Z30013 Encounter for initial prescription of injectable contraceptive: Secondary | ICD-10-CM | POA: Diagnosis not present

## 2023-12-10 DIAGNOSIS — Z3042 Encounter for surveillance of injectable contraceptive: Secondary | ICD-10-CM

## 2023-12-10 DIAGNOSIS — Z309 Encounter for contraceptive management, unspecified: Secondary | ICD-10-CM

## 2023-12-10 MED ORDER — MEDROXYPROGESTERONE ACETATE 150 MG/ML IM SUSP
150.0000 mg | INTRAMUSCULAR | Status: AC
  Administered 2023-12-10 – 2024-03-11 (×2): 150 mg via INTRAMUSCULAR
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
## 2023-12-10 NOTE — Progress Notes (Signed)
 Depo given in L. Deltoid and tolerated well, reminder card given. Larraine JONELLE Northern, RN

## 2023-12-17 ENCOUNTER — Other Ambulatory Visit: Payer: Self-pay

## 2023-12-17 ENCOUNTER — Emergency Department
Admission: EM | Admit: 2023-12-17 | Discharge: 2023-12-17 | Disposition: A | Discharge status: Home / Self Care | Attending: Emergency Medicine | Admitting: Emergency Medicine

## 2023-12-17 DIAGNOSIS — R1110 Vomiting, unspecified: Secondary | ICD-10-CM | POA: Insufficient documentation

## 2023-12-17 DIAGNOSIS — R519 Headache, unspecified: Secondary | ICD-10-CM | POA: Diagnosis present

## 2023-12-17 DIAGNOSIS — G43909 Migraine, unspecified, not intractable, without status migrainosus: Secondary | ICD-10-CM | POA: Diagnosis not present

## 2023-12-17 LAB — CBC WITH DIFFERENTIAL/PLATELET
Abs Immature Granulocytes: 0.02 K/uL (ref 0.00–0.07)
Basophils Absolute: 0 K/uL (ref 0.0–0.1)
Basophils Relative: 0 %
Eosinophils Absolute: 0.3 K/uL (ref 0.0–0.5)
Eosinophils Relative: 3 %
HCT: 41.4 % (ref 36.0–46.0)
Hemoglobin: 13.4 g/dL (ref 12.0–15.0)
Immature Granulocytes: 0 %
Lymphocytes Relative: 33 %
Lymphs Abs: 3.2 K/uL (ref 0.7–4.0)
MCH: 28.7 pg (ref 26.0–34.0)
MCHC: 32.4 g/dL (ref 30.0–36.0)
MCV: 88.7 fL (ref 80.0–100.0)
Monocytes Absolute: 0.4 K/uL (ref 0.1–1.0)
Monocytes Relative: 4 %
Neutro Abs: 5.6 K/uL (ref 1.7–7.7)
Neutrophils Relative %: 60 %
Platelets: 294 K/uL (ref 150–400)
RBC: 4.67 MIL/uL (ref 3.87–5.11)
RDW: 12.9 % (ref 11.5–15.5)
WBC: 9.5 K/uL (ref 4.0–10.5)
nRBC: 0 % (ref 0.0–0.2)

## 2023-12-17 LAB — BASIC METABOLIC PANEL WITH GFR
Anion gap: 7 (ref 5–15)
BUN: 12 mg/dL (ref 6–20)
CO2: 24 mmol/L (ref 22–32)
Calcium: 9.2 mg/dL (ref 8.9–10.3)
Chloride: 109 mmol/L (ref 98–111)
Creatinine, Ser: 0.7 mg/dL (ref 0.44–1.00)
GFR, Estimated: >60 mL/min (ref >60)
Glucose, Bld: 100 mg/dL — ABNORMAL HIGH (ref 70–99)
Potassium: 3.4 mmol/L — ABNORMAL LOW (ref 3.5–5.1)
Sodium: 140 mmol/L (ref 135–145)

## 2023-12-17 LAB — HCG, QUANTITATIVE, PREGNANCY: hCG, Beta Chain, Quant, S: <1 m[IU]/mL (ref <5)

## 2023-12-17 MED ORDER — DIPHENHYDRAMINE HCL 50 MG/ML IJ SOLN
25.0000 mg | Freq: Once | INTRAMUSCULAR | Status: AC
  Administered 2023-12-17: 25 mg via INTRAVENOUS
  Filled 2023-12-17: qty 1

## 2023-12-17 MED ORDER — METOCLOPRAMIDE HCL 10 MG PO TABS: 10.0000 mg | ORAL_TABLET | Freq: Three times a day (TID) | ORAL | 0 refills | Status: AC | PRN

## 2023-12-17 MED ORDER — METOCLOPRAMIDE HCL 5 MG/ML IJ SOLN
10.0000 mg | Freq: Once | INTRAMUSCULAR | Status: AC
  Administered 2023-12-17: 10 mg via INTRAVENOUS
  Filled 2023-12-17: qty 2

## 2023-12-17 NOTE — ED Triage Notes (Signed)
 Pt reports headache and nausea that began yesterday. Pt reports hx migraines.

## 2023-12-17 NOTE — ED Provider Notes (Signed)
 [facility] Provider Note    Event Date/Time   First MD Initiated Contact with Patient 12/17/23 [phone]     (approximate)   History   Headache   HPI  Patient declined interpreter and was fluent in English  Lisa [name] is a 33 y.o. female with a history of migraines who presents with a headache, gradual onset since yesterday, similar to prior migraines, and bilateral mainly in the front of her head and behind her eyes.  It is associated with nausea and vomiting as well as with photophobia.  She denies any head trauma.  I reviewed the past medical records.  The patient's most recent outpatient encounter was with [facility] department family-planning clinic on 7/30 for an annual wellness and contraception visit.   Physical Exam   Triage Vital Signs: ED Triage Vitals  Encounter Vitals Group     BP 12/17/23 0049 118/82     Girls Systolic BP Percentile --      Girls Diastolic BP Percentile --      Boys Systolic BP Percentile --      Boys Diastolic BP Percentile --      Pulse Rate 12/17/23 0049 89     Resp 12/17/23 0049 18     Temp 12/17/23 0049 98 F (36.7 C)     Temp src --      SpO2 12/17/23 0049 100 %     Weight 12/17/23 0048 161 lb (73 kg)     Height 12/17/23 0048 5' 2 (1.575 m)     Head Circumference --      Peak Flow --      Pain Score 12/17/23 0048 10     Pain Loc --      Pain Education --      Exclude from Growth Chart --     Most recent vital signs: Vitals:   12/17/23 0330 12/17/23 0430  BP: 115/80 119/71  Pulse: 73 70  Resp: 18 16  Temp:    SpO2: 100% 100%     General: Alert, No distress.  CV:  Good peripheral perfusion.  Resp:  Normal effort.  Abd:  No distention.  Other:  EOMI.  PERRLA.  Moderate photophobia.  No facial droop.  Normal speech.  Motor intact in all extremities.  Normal coordination.  Neck supple, full ROM.   ED Results / Procedures / Treatments   Labs (all labs ordered are listed,  but only abnormal results are displayed) Labs Reviewed  BASIC METABOLIC PANEL WITH GFR - Abnormal; Notable for the following components:      Result Value   Potassium 3.4 (*)    Glucose, Bld 100 (*)    All other components within normal limits  CBC WITH DIFFERENTIAL/PLATELET  HCG, QUANTITATIVE, PREGNANCY     EKG    RADIOLOGY    PROCEDURES:  Critical Care performed: No  Procedures   MEDICATIONS ORDERED IN ED: Medications  metoCLOPramide  (REGLAN ) injection 10 mg (10 mg Intravenous Given 12/17/23 0355)  diphenhydrAMINE  (BENADRYL ) injection 25 mg (25 mg Intravenous Given 12/17/23 0356)     IMPRESSION / MDM / ASSESSMENT AND PLAN / ED COURSE  I reviewed the triage vital signs and the nursing notes.  33 year old female with PMH as noted above presents with a headache since yesterday typical of her prior migraines.  On exam the patient is overall well-appearing.  Vital signs are normal.  Neurologic exam is nonfocal.  There are no meningeal signs.  Differential diagnosis includes, but is not limited to, migraine, tension headache, other benign headache etiology.  Given the patient's age and headache consistent with prior episodes, there is no indication for imaging.  BMP and CBC were obtained from triage and are unremarkable.  We will give Reglan  and Benadryl  and reassess.  Patient's presentation is most consistent with acute complicated illness / injury requiring diagnostic workup.  ----------------------------------------- 5:26 AM on 12/17/2023 -----------------------------------------  The patient reports that the headache is almost completely resolved.  She is feeling well and would like to go home.  She is stable for discharge at this time.  Return precautions given, and she expresses understanding.   FINAL CLINICAL IMPRESSION(S) / ED DIAGNOSES   Final diagnoses:  Migraine without status migrainosus, not intractable, unspecified migraine type     Rx / DC Orders   ED  Discharge Orders          Ordered    metoCLOPramide  (REGLAN ) 10 MG tablet  Every 8 hours PRN        12/17/23 0525             Note:  This document was prepared using Dragon voice recognition software and may include unintentional dictation errors.    Jacolyn Pae, MD 12/17/23 [phone]
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
## 2024-02-28 ENCOUNTER — Emergency Department
Admission: EM | Admit: 2024-02-28 | Discharge: 2024-02-29 | Disposition: A | Attending: Emergency Medicine | Admitting: Emergency Medicine

## 2024-02-28 ENCOUNTER — Emergency Department

## 2024-02-28 DIAGNOSIS — M25561 Pain in right knee: Secondary | ICD-10-CM | POA: Diagnosis present

## 2024-02-28 DIAGNOSIS — M25461 Effusion, right knee: Secondary | ICD-10-CM | POA: Diagnosis not present
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
## 2024-02-28 NOTE — ED Triage Notes (Signed)
 Pt arrived to ED d/t pain in both knees with worse in right with some swelling. Pt states that the pain just began spontaneously over past two days. Pt states that it is a shooting pain in her right leg that goes from her knee upwards.
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
## 2024-02-29 MED ORDER — KETOROLAC TROMETHAMINE 60 MG/2ML IM SOLN
60.0000 mg | Freq: Once | INTRAMUSCULAR | Status: AC
  Administered 2024-02-29: 60 mg via INTRAMUSCULAR
  Filled 2024-02-29: qty 2

## 2024-02-29 MED ORDER — PREDNISONE 20 MG PO TABS
60.0000 mg | ORAL_TABLET | Freq: Once | ORAL | Status: AC
  Administered 2024-02-29: 60 mg via ORAL
  Filled 2024-02-29: qty 3

## 2024-02-29 MED ORDER — HYDROCODONE-ACETAMINOPHEN 5-325 MG PO TABS: 1.0000 | ORAL_TABLET | ORAL | 0 refills | Status: AC | PRN

## 2024-02-29 MED ORDER — PREDNISONE 20 MG PO TABS: 40.0000 mg | ORAL_TABLET | Freq: Every day | ORAL | 0 refills | Status: AC

## 2024-02-29 NOTE — Discharge Instructions (Addendum)
 Please take your prednisone/steroid for the next 5 days as written.  Please take your pain medication as needed but only as prescribed.  Do not drink alcohol or drive while taking pain medication.  Please call the number provided for orthopedics to arrange a follow-up appointment to discuss your knee pain further.  If the knee pain or swelling worsens or you develop a fever please return to the emergency department.

## 2024-03-11 ENCOUNTER — Ambulatory Visit

## 2024-03-11 VITALS — BP 118/72 | Ht 62.0 in | Wt 176.0 lb

## 2024-03-11 DIAGNOSIS — Z309 Encounter for contraceptive management, unspecified: Secondary | ICD-10-CM | POA: Diagnosis not present

## 2024-03-11 DIAGNOSIS — Z3009 Encounter for other general counseling and advice on contraception: Secondary | ICD-10-CM

## 2024-03-11 DIAGNOSIS — Z3042 Encounter for surveillance of injectable contraceptive: Secondary | ICD-10-CM

## 2024-03-11 DIAGNOSIS — Z30013 Encounter for initial prescription of injectable contraceptive: Secondary | ICD-10-CM | POA: Diagnosis not present

## 2024-03-11 NOTE — Progress Notes (Signed)
 13 Weeks   0 Days since last Depo Interpreter: Lisa [name]   Voices no concerns today.  Counseled to adhere to 11 to 13 week intervals between depo injections for optimal benefit.  Depo given today per order by C. Macario MD  dated 11/11/2023.  Tolerated well Given Left Deltoid- patient's request due to cortisone injection in right shoulder recently and hurting.  Next depo due 05/26/2024 has reminder card.

## 2024-05-10 ENCOUNTER — Emergency Department

## 2024-05-10 ENCOUNTER — Other Ambulatory Visit: Payer: Self-pay

## 2024-05-10 ENCOUNTER — Encounter: Payer: Self-pay | Admitting: Emergency Medicine

## 2024-05-10 ENCOUNTER — Emergency Department
Admission: EM | Admit: 2024-05-10 | Discharge: 2024-05-11 | Disposition: A | Attending: Emergency Medicine | Admitting: Emergency Medicine

## 2024-05-10 DIAGNOSIS — G8929 Other chronic pain: Secondary | ICD-10-CM | POA: Diagnosis not present

## 2024-05-10 DIAGNOSIS — G43809 Other migraine, not intractable, without status migrainosus: Secondary | ICD-10-CM | POA: Diagnosis not present

## 2024-05-10 DIAGNOSIS — R519 Headache, unspecified: Secondary | ICD-10-CM | POA: Diagnosis present

## 2024-05-10 DIAGNOSIS — M25561 Pain in right knee: Secondary | ICD-10-CM | POA: Diagnosis not present

## 2024-05-10 MED ORDER — METOCLOPRAMIDE HCL 5 MG/ML IJ SOLN
10.0000 mg | Freq: Once | INTRAMUSCULAR | Status: AC
  Administered 2024-05-10: 10 mg via INTRAMUSCULAR
  Filled 2024-05-10: qty 2

## 2024-05-10 MED ORDER — OXYCODONE-ACETAMINOPHEN 5-325 MG PO TABS
1.0000 | ORAL_TABLET | ORAL | Status: DC | PRN
  Administered 2024-05-10: 1 via ORAL
  Filled 2024-05-10: qty 1

## 2024-05-10 MED ORDER — ONDANSETRON 4 MG PO TBDP
4.0000 mg | ORAL_TABLET | Freq: Once | ORAL | Status: AC
  Administered 2024-05-10: 4 mg via ORAL
  Filled 2024-05-10: qty 1

## 2024-05-10 NOTE — ED Notes (Signed)
 Pt's daughter approached RN station to report HA is not any better and requesting additional medication. Pt ambulatory to triage, gait steady, no acute distress noted. Will use standing orders for pain, see MAR

## 2024-05-10 NOTE — Progress Notes (Signed)
 Lisa [name] is a 33 y.o. female presenting to clinic for flu testing headache 2x days

## 2024-05-10 NOTE — ED Provider Notes (Signed)
----------------------------------------- °  7:39 PM on 05/10/2024 -----------------------------------------  I was asked by RN Marcine whether the patient presentation may warrant code stroke activation.  I evaluated the patient in triage.  She has had a headache for the last 3 days with paresthesias to the left side of her face that started around 4 PM today.  She states that she has had similar paresthesias with a migraine once before.  She has no tingling or numbness in her upper or lower extremities.  She has no weakness.  She has normal gait.  On exam she has no facial droop or asymmetry.  She has 5/5 motor strength and intact sensation to bilateral upper and lower extremities with no drift.  At this time there is no neurodeficit on exam, and no clinical evidence of acute CVA.  There is no indication for code stroke activation.   Jacolyn Pae, MD 05/10/24 [phone]
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
## 2024-05-10 NOTE — ED Notes (Signed)
 Consulted siadecki, MD, he came to triage and assessed pt. Orders to follow.

## 2024-05-10 NOTE — Progress Notes (Addendum)
 Subjective Patient ID: Lisa [name] is a 33 y.o. female.    Patient presents with worst pain of life headache, predominantly on left side. Patient has history of headaches and migraines with ER management, however, this headache is worse, she has additional symptoms of numbness, eye changes, and pain unlike prior headaches. Cannot properly assess in this setting.     History provided by:  Patient Language interpreter used: Yes     Review of Systems  Eyes:  Positive for pain.  Neurological:  Positive for headaches.  Psychiatric/Behavioral:  Positive for confusion and decreased concentration.     Patient History  Allergies: Allergies  Allergen Reactions   Amoxicillin Anaphylaxis, Itching, Other, Rash and Swelling    Other Reaction(s): Other (See Comments)  itching  Throat swelling   Latex Itching   Dicyclomine Other    Other Reaction(s): Other (See Comments)  Tooth ache leg pain and weakness, Tooth ache leg pain and weakness  Tooth ache leg pain and weakness  Tooth ache leg pain and weakness  Tooth ache leg pain and weakness  Tooth ache leg pain and weakness, Tooth ache leg pain and weakness    Tooth ache leg pain and weakness Tooth ache leg pain and weakness    Tooth ache leg pain and weakness   Diclofenac Itching, Other and Tinnitus    headache  Other Reaction(s): dizziness--able to take other NSAIDs without problems    headache    Makes it sound like she's in a hole, headache  headache    Makes it sound like she's in a hole, headache    Other Reaction(s): dizziness--able to take other NSAIDs without problems  headache  Makes it sound like she's in a hole, headache  Makes it sound like she's in a hole, headache    Past Medical History:  Diagnosis Date   Osteoarthritis    History reviewed. No pertinent surgical history. Social History   Socioeconomic History   Marital status: Not on file    Spouse name: Not on file   Number of  children: Not on file   Years of education: Not on file   Highest education level: Not on file  Occupational History   Not on file  Tobacco Use   Smoking status: Never   Smokeless tobacco: Never  Substance and Sexual Activity   Alcohol use: Not on file   Drug use: Not on file   Sexual activity: Not on file  Other Topics Concern   Not on file  Social History Narrative   Not on file   History reviewed. No pertinent family history. Current Outpatient Medications on File Prior to Visit  Medication Sig Dispense Refill   amitriptyline (Elavil) 25 MG tablet Take 25 mg by mouth. (Patient not taking: Reported on 02/27/2024)     ibuprofen  800 MG tablet Take 800 mg by mouth.     No current facility-administered medications on file prior to visit.    Objective  Vitals:   05/10/24 1754  BP: 113/80  Pulse: 78  Resp: 19  Temp: 36.6 C (97.8 F)  TempSrc: Tympanic  SpO2: 99%  Weight: 79.4 kg  Height: 5' 2  PainSc: 10-Worst pain ever        OBGYN/Pregnancy Status: Hysterectomy      No results found.  Physical Exam Vitals and nursing note reviewed.  Constitutional:      General: She is not in acute distress.    Appearance: Normal appearance.  HENT:  Head: Normocephalic.     Right Ear: Tympanic membrane normal.     Left Ear: Tympanic membrane normal.     Nose: Nose normal.     Mouth/Throat:     Mouth: Mucous membranes are moist.  Eyes:     Extraocular Movements: Extraocular movements intact.     Conjunctiva/sclera: Conjunctivae normal.     Pupils: Pupils are equal, round, and reactive to light.  Cardiovascular:     Rate and Rhythm: Normal rate and regular rhythm.     Pulses: Normal pulses.     Heart sounds: Normal heart sounds.  Pulmonary:     Effort: Pulmonary effort is normal.     Breath sounds: Normal breath sounds.  Abdominal:     General: There is no distension.  Musculoskeletal:        General: Normal range of motion.     Cervical back:  Normal range of motion.  Skin:    General: Skin is warm.     Capillary Refill: Capillary refill takes less than 2 seconds.  Neurological:     General: No focal deficit present.     Mental Status: She is alert and oriented to person, place, and time.     Motor: No weakness.     Coordination: Coordination normal.     Gait: Gait normal.     Deep Tendon Reflexes: Reflexes abnormal.     Comments: Slightly increased DTR at elbows, comparable bilat Extreme light sensitivity bilat affecting proper exam.  Able to perform thorough neuroexam due to patient pain complaint and light sensitivity.  Psychiatric:        Mood and Affect: Mood normal.        Behavior: Behavior normal.        Thought Content: Thought content normal.        Judgment: Judgment normal.     Results for orders placed or performed in visit on 05/10/24  POCT rapid influenza mckesson  Component Result   Rapid Influenza A Ag Negative   Rapid Influenza B Ag Negative   Internal Quality Control Pass  POCT QuickVue Antigen Test  Component Result   Rapid Covid Negative   Internal Quality Control Pass       Procedures MDM:     1+ Acute illness or injury that poses a threat to life or bodily function     Explanation of Medical Decision Making and variances from expected care:  I used multiple approaches, asked multiple questions, and tried to verify the extent of patient's symptoms.  She has the worst headache she has ever had, she has new symptoms of numbness on the left side and complains of eye pain on right, exam is benign for any emergent or urgent findings but unable to assess any further than clinical assessment.  Sent patient to the emergency room, EMS declined, patient did not go because she was concerned about childcare at home, but explained to patient that I do not have any way to rule out that your change in symptoms are benign and not of concern.  Patient stated understanding and states will present to ER.    Assessment requiring historian other than patient: Yes     Assessment requiring historian other than patient comment:  Friend   Independent visualization of image, tracing, or test: No     Discussion of management with another provider: No     Risk:: High            Assessment/Plan Diagnoses and all  orders for this visit:  Acute intractable headache, unspecified headache type -     POCT rapid influenza mckesson -     POCT QuickVue Antigen Test     Disposition Status: Emergency Department  Patient Instructions  Patient presents with worst pain of life headache, predominantly on left side. Patient has history of headaches and migraines with ER management, however, this headache is worse, she has additional symptoms of numbness and pain unlike prior headaches. Cannot properly assess in this setting.   Progress note signed by Debby Rung, PA on 05/10/24 at  6:55 PM

## 2024-05-11 MED ORDER — KETOROLAC TROMETHAMINE 15 MG/ML IJ SOLN
15.0000 mg | Freq: Once | INTRAMUSCULAR | Status: AC
  Administered 2024-05-11: 15 mg via INTRAVENOUS
  Filled 2024-05-11: qty 1

## 2024-05-11 MED ORDER — DIPHENHYDRAMINE HCL 50 MG/ML IJ SOLN
25.0000 mg | Freq: Once | INTRAMUSCULAR | Status: AC
  Administered 2024-05-11: 25 mg via INTRAVENOUS
  Filled 2024-05-11: qty 1

## 2024-05-11 MED ORDER — PROCHLORPERAZINE EDISYLATE 10 MG/2ML IJ SOLN
10.0000 mg | Freq: Once | INTRAMUSCULAR | Status: AC
  Administered 2024-05-11: 10 mg via INTRAVENOUS
  Filled 2024-05-11: qty 2

## 2024-05-11 NOTE — Discharge Instructions (Signed)
 Please call Dr. Maree who is a headache specialist for an appointment.  Thank you for choosing us  for your health care today!  Please see your primary doctor this week for a follow up appointment.   If you have any new, worsening, or unexpected symptoms call your doctor right away or come back to the emergency department for reevaluation.  It was my pleasure to care for you today.   Ginnie EDISON Cyrena, MD
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
## 2024-05-11 NOTE — ED Notes (Signed)
 ED Provider at bedside.

## 2024-06-21 ENCOUNTER — Ambulatory Visit
